# Patient Record
Sex: Male | Born: 1975 | Race: White | Hispanic: No | Marital: Married | Smoking: Current every day smoker
Health system: Southern US, Community
[De-identification: ages and names within clinical notes are randomized; demographics above are authoritative.]

## PROBLEM LIST (undated history)

## (undated) DIAGNOSIS — F32A Depression, unspecified: Secondary | ICD-10-CM

## (undated) DIAGNOSIS — N200 Calculus of kidney: Secondary | ICD-10-CM

## (undated) DIAGNOSIS — G894 Chronic pain syndrome: Secondary | ICD-10-CM

## (undated) DIAGNOSIS — F329 Major depressive disorder, single episode, unspecified: Secondary | ICD-10-CM

## (undated) DIAGNOSIS — M549 Dorsalgia, unspecified: Secondary | ICD-10-CM

## (undated) DIAGNOSIS — K219 Gastro-esophageal reflux disease without esophagitis: Secondary | ICD-10-CM

## (undated) DIAGNOSIS — Z765 Malingerer [conscious simulation]: Secondary | ICD-10-CM

## (undated) HISTORY — PX: HERNIA REPAIR: SHX51

## (undated) HISTORY — PX: APPENDECTOMY: SHX54

## (undated) HISTORY — PX: SHOULDER SURGERY: SHX246

---

## 2013-01-04 ENCOUNTER — Emergency Department (HOSPITAL_BASED_OUTPATIENT_CLINIC_OR_DEPARTMENT_OTHER)
Admission: EM | Admit: 2013-01-04 | Discharge: 2013-01-04 | Disposition: A | Discharge status: Home / Self Care | Payer: Managed Care, Other (non HMO) | Attending: Emergency Medicine | Admitting: Emergency Medicine

## 2013-01-04 ENCOUNTER — Emergency Department (HOSPITAL_BASED_OUTPATIENT_CLINIC_OR_DEPARTMENT_OTHER): Payer: Managed Care, Other (non HMO)

## 2013-01-04 ENCOUNTER — Encounter (HOSPITAL_BASED_OUTPATIENT_CLINIC_OR_DEPARTMENT_OTHER): Payer: Self-pay | Admitting: Emergency Medicine

## 2013-01-04 DIAGNOSIS — R109 Unspecified abdominal pain: Secondary | ICD-10-CM | POA: Diagnosis present

## 2013-01-04 DIAGNOSIS — R34 Anuria and oliguria: Secondary | ICD-10-CM | POA: Insufficient documentation

## 2013-01-04 DIAGNOSIS — R110 Nausea: Secondary | ICD-10-CM | POA: Insufficient documentation

## 2013-01-04 DIAGNOSIS — F17200 Nicotine dependence, unspecified, uncomplicated: Secondary | ICD-10-CM | POA: Insufficient documentation

## 2013-01-04 DIAGNOSIS — Z9089 Acquired absence of other organs: Secondary | ICD-10-CM | POA: Insufficient documentation

## 2013-01-04 DIAGNOSIS — Z87442 Personal history of urinary calculi: Secondary | ICD-10-CM | POA: Insufficient documentation

## 2013-01-04 HISTORY — DX: Calculus of kidney: N20.0

## 2013-01-04 LAB — CBC WITH DIFFERENTIAL/PLATELET
Basophils Relative: 1 % (ref 0–1)
Eosinophils Absolute: 0.3 10*3/uL (ref 0.0–0.7)
Eosinophils Relative: 4 % (ref 0–5)
HCT: 39.7 % (ref 39.0–52.0)
Hemoglobin: 14.3 g/dL (ref 13.0–17.0)
Lymphs Abs: 2.4 10*3/uL (ref 0.7–4.0)
MCH: 31.2 pg (ref 26.0–34.0)
MCHC: 36 g/dL (ref 30.0–36.0)
MCV: 86.5 fL (ref 78.0–100.0)
Monocytes Absolute: 0.4 10*3/uL (ref 0.1–1.0)
Monocytes Relative: 5 % (ref 3–12)
Neutrophils Relative %: 61 % (ref 43–77)
RBC: 4.59 MIL/uL (ref 4.22–5.81)
RDW: 13.3 % (ref 11.5–15.5)

## 2013-01-04 LAB — LIPASE, BLOOD: Lipase: 48 U/L (ref 11–59)

## 2013-01-04 LAB — COMPREHENSIVE METABOLIC PANEL
BUN: 8 mg/dL (ref 6–23)
Calcium: 9.3 mg/dL (ref 8.4–10.5)
Chloride: 105 mEq/L (ref 96–112)
GFR calc Af Amer: >90 mL/min (ref >90)
Glucose, Bld: 105 mg/dL — ABNORMAL HIGH (ref 70–99)
Total Protein: 6.7 g/dL (ref 6.0–8.3)

## 2013-01-04 LAB — URINALYSIS, ROUTINE W REFLEX MICROSCOPIC
Bilirubin Urine: NEGATIVE
Hgb urine dipstick: NEGATIVE
Ketones, ur: NEGATIVE mg/dL
Nitrite: NEGATIVE
Specific Gravity, Urine: 1.011 (ref 1.005–1.030)
Urobilinogen, UA: 0.2 mg/dL (ref 0.0–1.0)
pH: 5.5 (ref 5.0–8.0)

## 2013-01-04 MED ORDER — ONDANSETRON HCL 4 MG/2ML IJ SOLN
4.0000 mg | Freq: Once | INTRAMUSCULAR | Status: AC
  Administered 2013-01-04: 4 mg via INTRAVENOUS
  Filled 2013-01-04: qty 2

## 2013-01-04 MED ORDER — OXYCODONE-ACETAMINOPHEN 5-325 MG PO TABS: 1.0000 | ORAL_TABLET | Freq: Four times a day (QID) | ORAL | Status: DC | PRN

## 2013-01-04 MED ORDER — SODIUM CHLORIDE 0.9 % IV BOLUS (SEPSIS)
1000.0000 mL | INTRAVENOUS | Status: AC
  Administered 2013-01-04: 1000 mL via INTRAVENOUS

## 2013-01-04 MED ORDER — HYDROMORPHONE HCL PF 1 MG/ML IJ SOLN
1.0000 mg | INTRAMUSCULAR | Status: AC
  Administered 2013-01-04: 1 mg via INTRAVENOUS
  Filled 2013-01-04: qty 1

## 2013-01-04 NOTE — ED Notes (Signed)
Sts has had pain for the past few months, had sx last year removing a 6mm stone, hadn't had problems until recently, the last few days it has gotten worse, difficulty urinating.

## 2013-01-04 NOTE — ED Provider Notes (Signed)
CSN: [id number]     Arrival date & time 01/04/13  1804 History  This chart was scribed for Junius Argyle, MD by Bennett Scrape, ED Scribe. This patient was seen in room MH06/MH06 and the patient's care was started at 7:30 PM.   Chief Complaint  Patient presents with  . Flank Pain    Patient is a 37 y.o. male presenting with flank pain. The history is provided by the patient. No language interpreter was used.  Flank Pain This is a recurrent problem. The current episode started 2 days ago. The problem occurs daily. The problem has been gradually worsening. Pertinent negatives include no chest pain, no abdominal pain, no headaches and no shortness of breath. Nothing aggravates the symptoms. Nothing relieves the symptoms. He has tried acetaminophen for the symptoms. The treatment provided no relief.    HPI Comments: Kyle Franklin is a 37 y.o. male with a h/o kidney stones who presents to the Emergency Department complaining of intermittent right flank pain described as sharp. He reports that he has been having intermittent pain over the past few months but states that the pain increased in the past 2 days. He rates his pain an 8 out of 10 currently. He reports associated nausea that has been controlled with phenergan and difficulty urinating described as having to force the urine out. He states that he also passed a left sided kidney stone earlier this week without complications. He states that he has been seen for kidney stones at [facility] in the past but no prior [facility] facilities. He reports that he had kidney stone removed at [facility] last year with no further episodes until recently. He denies emesis and hematuria as associated symptoms. He denies any other chronic medical conditions but is a current everyday smoker.   Past Medical History  Diagnosis Date  . Kidney stones    Past Surgical History  Procedure Laterality Date  . Appendectomy    . Shoulder surgery     No family  history on file. History  Substance Use Topics  . Smoking status: Current Every Day Smoker -- 1.00 packs/day    Types: Cigarettes  . Smokeless tobacco: Not on file     Comment: Sts working on it - "going back on my vapor"  . Alcohol Use: No    Review of Systems  Constitutional: Negative for fever and fatigue.  HENT: Negative for congestion and drooling.   Eyes: Negative for pain.  Respiratory: Negative for cough and shortness of breath.   Cardiovascular: Negative for chest pain.  Gastrointestinal: Positive for nausea. Negative for vomiting, abdominal pain and diarrhea.  Genitourinary: Positive for flank pain and difficulty urinating. Negative for dysuria and hematuria.  Musculoskeletal: Negative for neck pain.  Skin: Negative for color change.  Neurological: Negative for dizziness and headaches.  Hematological: Negative for adenopathy.  Psychiatric/Behavioral: Negative for behavioral problems.  All other systems reviewed and are negative.    Allergies  Ciprofloxacin causes nausea; Tape; and Vicodin causes itching--per pt at beside.   Home Medications   Current Outpatient Rx  Name  Route  Sig  Dispense  Refill  . ibuprofen (ADVIL,MOTRIN) 800 MG tablet   Oral   Take 800 mg by mouth every 8 (eight) hours as needed for pain.          Triage vitals: Pulse 97  Temp(Src) 98.3 F (36.8 C) (Oral)  Resp 20  SpO2 100%  Physical Exam  Nursing note and vitals reviewed. Constitutional: He  is oriented to person, place, and time. He appears well-developed and well-nourished. No distress.  HENT:  Head: Normocephalic and atraumatic.  Mouth/Throat: Oropharynx is clear and moist.  Eyes: Conjunctivae and EOM are normal. Pupils are equal, round, and reactive to light.  Neck: Neck supple. No tracheal deviation present.  Cardiovascular: Normal rate, regular rhythm and normal heart sounds.   Pulmonary/Chest: Effort normal and breath sounds normal. No respiratory distress.  Abdominal:  Soft. There is no tenderness.  Musculoskeletal: Normal range of motion.  Right CVA tenderness   Neurological: He is alert and oriented to person, place, and time.  Skin: Skin is warm and dry.  Psychiatric: He has a normal mood and affect. His behavior is normal.    ED Course  Procedures (including critical care time)  DIAGNOSTIC STUDIES: Oxygen Saturation is 100% on room air, normal by my interpretation.    COORDINATION OF CARE: 7:34 PM-Discussed treatment plan which includes IV fluids, pain medications, CT of abdomen and UA with pt at bedside and pt agreed to plan.   Labs Review Labs Reviewed  COMPREHENSIVE METABOLIC PANEL - Abnormal; Notable for the following:    Glucose, Bld 105 (*)    All other components within normal limits  URINALYSIS, ROUTINE W REFLEX MICROSCOPIC  CBC WITH DIFFERENTIAL  LIPASE, BLOOD   Imaging Review Ct Abdomen Pelvis Wo Contrast  01/04/2013   CLINICAL DATA:  Right flank pain, history of kidney stones  EXAM: CT ABDOMEN AND PELVIS WITHOUT CONTRAST  TECHNIQUE: Multidetector CT imaging of the abdomen and pelvis was performed following the standard protocol without intravenous contrast.  COMPARISON:  08/22/2011  FINDINGS: The lung bases are free of acute infiltrate or sizable effusion.  The liver, gallbladder, spleen, adrenal glands and pancreas are within normal limits.  The kidneys are well visualized bilaterally. No obstructive changes are noted. A small nonobstructing stone is noted in the midportion of the right kidney. The appendix is been surgically removed. Mild diverticular change of the colon is seen. No diverticulitis is noted. The bladder is well distended. No pelvic mass lesion or sidewall abnormality is seen. The bony structures are within normal limits.  IMPRESSION: Small 2 mm nonobstructing right renal stone. No other focal abnormality is noted.   Electronically Signed   By: Alcide Clever M.D.   On: 01/04/2013 20:43    EKG Interpretation   None        MDM   1. Right flank pain    7:59 PM 37 y.o. male who presents with intermittent right flank pain for several months and acute worsening over the last few days. The patient notes that he passed a kidney stone earlier in the week. He denies any fevers, has had some nausea but no vomiting. He notes frequency. He is afebrile and vital signs are unremarkable here. He reports that he had previous instrumentation for removal of a 6 mm stone at [facility]. Will get screening lab work and CT scan.  9:51 PM: Labs grossly normal. CT shows mall 2mm right renal stone. Possible that pt passed the stone pta.  I have discussed the diagnosis/risks/treatment options with the patient and believe the pt to be eligible for discharge home to follow-up with his urologist. We also discussed returning to the ED immediately if new or worsening sx occur. We discussed the sx which are most concerning (e.g., worsening pain, fever) that necessitate immediate return. Any new prescriptions provided to the patient are listed below.  New Prescriptions   OXYCODONE-ACETAMINOPHEN (PERCOCET)  5-325 MG PER TABLET    Take 1 tablet by mouth every 6 (six) hours as needed for pain.      I personally performed the services described in this documentation, which was scribed in my presence. The recorded information has been reviewed and is accurate.    Junius Argyle, MD 01/04/13 2152

## 2013-01-04 NOTE — ED Notes (Signed)
Pt returned to room from CT

## 2014-05-19 ENCOUNTER — Emergency Department (HOSPITAL_BASED_OUTPATIENT_CLINIC_OR_DEPARTMENT_OTHER)
Admission: EM | Admit: 2014-05-19 | Discharge: 2014-05-19 | Disposition: A | Discharge status: Home / Self Care | Payer: Medicaid Other | Attending: Emergency Medicine | Admitting: Emergency Medicine

## 2014-05-19 ENCOUNTER — Encounter (HOSPITAL_BASED_OUTPATIENT_CLINIC_OR_DEPARTMENT_OTHER): Payer: Self-pay

## 2014-05-19 DIAGNOSIS — R1030 Lower abdominal pain, unspecified: Secondary | ICD-10-CM | POA: Diagnosis present

## 2014-05-19 DIAGNOSIS — Z79899 Other long term (current) drug therapy: Secondary | ICD-10-CM | POA: Insufficient documentation

## 2014-05-19 DIAGNOSIS — Z87442 Personal history of urinary calculi: Secondary | ICD-10-CM | POA: Insufficient documentation

## 2014-05-19 DIAGNOSIS — Z7289 Other problems related to lifestyle: Secondary | ICD-10-CM | POA: Diagnosis not present

## 2014-05-19 DIAGNOSIS — Z765 Malingerer [conscious simulation]: Secondary | ICD-10-CM

## 2014-05-19 DIAGNOSIS — R110 Nausea: Secondary | ICD-10-CM | POA: Insufficient documentation

## 2014-05-19 DIAGNOSIS — R109 Unspecified abdominal pain: Secondary | ICD-10-CM

## 2014-05-19 LAB — BASIC METABOLIC PANEL
Anion gap: 10 (ref 5–15)
BUN: 8 mg/dL (ref 6–23)
CALCIUM: 8.8 mg/dL (ref 8.4–10.5)
CO2: 25 mmol/L (ref 19–32)
Chloride: 104 mmol/L (ref 96–112)
Creatinine, Ser: 0.91 mg/dL (ref 0.50–1.35)
GFR calc Af Amer: >90 mL/min (ref >90)
GFR calc non Af Amer: >90 mL/min (ref >90)
GLUCOSE: 167 mg/dL — AB (ref 70–99)
POTASSIUM: 3.5 mmol/L (ref 3.5–5.1)
Sodium: 139 mmol/L (ref 135–145)

## 2014-05-19 LAB — CBC WITH DIFFERENTIAL/PLATELET
Basophils Absolute: 0 10*3/uL (ref 0.0–0.1)
Basophils Relative: 0 % (ref 0–1)
Eosinophils Absolute: 0.1 10*3/uL (ref 0.0–0.7)
Eosinophils Relative: 1 % (ref 0–5)
HEMATOCRIT: 42.7 % (ref 39.0–52.0)
HEMOGLOBIN: 14.5 g/dL (ref 13.0–17.0)
LYMPHS PCT: 8 % — AB (ref 12–46)
Lymphs Abs: 1.1 10*3/uL (ref 0.7–4.0)
MCH: 30.9 pg (ref 26.0–34.0)
MCHC: 34 g/dL (ref 30.0–36.0)
MCV: 90.9 fL (ref 78.0–100.0)
MONO ABS: 0.6 10*3/uL (ref 0.1–1.0)
Monocytes Relative: 4 % (ref 3–12)
Neutro Abs: 11.1 10*3/uL — ABNORMAL HIGH (ref 1.7–7.7)
Neutrophils Relative %: 87 % — ABNORMAL HIGH (ref 43–77)
PLATELETS: 168 10*3/uL (ref 150–400)
RBC: 4.7 MIL/uL (ref 4.22–5.81)
RDW: 13.4 % (ref 11.5–15.5)
WBC: 12.9 10*3/uL — ABNORMAL HIGH (ref 4.0–10.5)

## 2014-05-19 LAB — HEPATIC FUNCTION PANEL
ALK PHOS: 42 U/L (ref 39–117)
ALT: 30 U/L (ref 0–53)
AST: 29 U/L (ref 0–37)
Albumin: 3.8 g/dL (ref 3.5–5.2)
BILIRUBIN DIRECT: 0.1 mg/dL (ref 0.0–0.5)
BILIRUBIN TOTAL: 0.6 mg/dL (ref 0.3–1.2)
Indirect Bilirubin: 0.5 mg/dL (ref 0.3–0.9)
Total Protein: 6.4 g/dL (ref 6.0–8.3)

## 2014-05-19 LAB — URINALYSIS, ROUTINE W REFLEX MICROSCOPIC
Bilirubin Urine: NEGATIVE
Glucose, UA: NEGATIVE mg/dL
Hgb urine dipstick: NEGATIVE
Ketones, ur: NEGATIVE mg/dL
Leukocytes, UA: NEGATIVE
NITRITE: NEGATIVE
PH: 8 (ref 5.0–8.0)
Protein, ur: NEGATIVE mg/dL
SPECIFIC GRAVITY, URINE: 1.007 (ref 1.005–1.030)
UROBILINOGEN UA: 1 mg/dL (ref 0.0–1.0)

## 2014-05-19 MED ORDER — MORPHINE SULFATE 4 MG/ML IJ SOLN
4.0000 mg | Freq: Once | INTRAMUSCULAR | Status: AC
  Administered 2014-05-19: 4 mg via INTRAVENOUS
  Filled 2014-05-19: qty 1

## 2014-05-19 MED ORDER — SODIUM CHLORIDE 0.9 % IV BOLUS (SEPSIS)
1000.0000 mL | Freq: Once | INTRAVENOUS | Status: AC
  Administered 2014-05-19: 1000 mL via INTRAVENOUS

## 2014-05-19 MED ORDER — OXYCODONE-ACETAMINOPHEN 5-325 MG PO TABS: 1.0000 | ORAL_TABLET | ORAL | Status: DC | PRN

## 2014-05-19 MED ORDER — KETOROLAC TROMETHAMINE 30 MG/ML IJ SOLN
30.0000 mg | Freq: Once | INTRAMUSCULAR | Status: AC
  Administered 2014-05-19: 30 mg via INTRAVENOUS
  Filled 2014-05-19: qty 1

## 2014-05-19 MED ORDER — ONDANSETRON 4 MG PO TBDP: 4.0000 mg | ORAL_TABLET | Freq: Three times a day (TID) | ORAL | Status: DC | PRN

## 2014-05-19 MED ORDER — IBUPROFEN 800 MG PO TABS: 800.0000 mg | ORAL_TABLET | Freq: Three times a day (TID) | ORAL | Status: DC | PRN

## 2014-05-19 MED ORDER — ONDANSETRON HCL 4 MG/2ML IJ SOLN
4.0000 mg | Freq: Once | INTRAMUSCULAR | Status: AC
  Administered 2014-05-19: 4 mg via INTRAVENOUS
  Filled 2014-05-19: qty 2

## 2014-05-19 NOTE — Discharge Instructions (Signed)
Flank Pain °Flank pain refers to pain that is located on the side of the body between the upper abdomen and the back. The pain may occur over a short period of time (acute) or may be long-term or reoccurring (chronic). It may be mild or severe. Flank pain can be caused by many things. °CAUSES  °Some of the more common causes of flank pain include: °· Muscle strains.   °· Muscle spasms.   °· A disease of your spine (vertebral disk disease).   °· A lung infection (pneumonia).   °· Fluid around your lungs (pulmonary edema).   °· A kidney infection.   °· Kidney stones.   °· A very painful skin rash caused by the chickenpox virus (shingles).   °· Gallbladder disease.   °HOME CARE INSTRUCTIONS  °Home care will depend on the cause of your pain. In general, °· Rest as directed by your caregiver. °· Drink enough fluids to keep your urine clear or pale yellow. °· Only take over-the-counter or prescription medicines as directed by your caregiver. Some medicines may help relieve the pain. °· Tell your caregiver about any changes in your pain. °· Follow up with your caregiver as directed. °SEEK IMMEDIATE MEDICAL CARE IF:  °· Your pain is not controlled with medicine.   °· You have new or worsening symptoms. °· Your pain increases.   °· You have abdominal pain.   °· You have shortness of breath.   °· You have persistent nausea or vomiting.   °· You have swelling in your abdomen.   °· You feel faint or pass out.   °· You have blood in your urine. °· You have a fever or persistent symptoms for more than 2-3 days. °· You have a fever and your symptoms suddenly get worse. °MAKE SURE YOU:  °· Understand these instructions. °· Will watch your condition. °· Will get help right away if you are not doing well or get worse. °Document Released: 04/13/2005 Document Revised: 11/15/2011 Document Reviewed: 10/05/2011 °ExitCare® Patient Information ©2015 [employer]. This information is not intended to replace advice given to you by your  health care provider. Make sure you discuss any questions you have with your health care provider. ° °Kidney Stones °Kidney stones (urolithiasis) are deposits that form inside your kidneys. The intense pain is caused by the stone moving through the urinary tract. When the stone moves, the ureter goes into spasm around the stone. The stone is usually passed in the urine.  °CAUSES  °· A disorder that makes certain neck glands produce too much parathyroid hormone (primary hyperparathyroidism). °· A buildup of uric acid crystals, similar to gout in your joints. °· Narrowing (stricture) of the ureter. °· A kidney obstruction present at birth (congenital obstruction). °· Previous surgery on the kidney or ureters. °· Numerous kidney infections. °SYMPTOMS  °· Feeling sick to your stomach (nauseous). °· Throwing up (vomiting). °· Blood in the urine (hematuria). °· Pain that usually spreads (radiates) to the groin. °· Frequency or urgency of urination. °DIAGNOSIS  °· Taking a history and physical exam. °· Blood or urine tests. °· CT scan. °· Occasionally, an examination of the inside of the urinary bladder (cystoscopy) is performed. °TREATMENT  °· Observation. °· Increasing your fluid intake. °· Extracorporeal shock wave lithotripsy--This is a noninvasive procedure that uses shock waves to break up kidney stones. °· Surgery may be needed if you have severe pain or persistent obstruction. There are various surgical procedures. Most of the procedures are performed with the use of small instruments. Only small incisions are needed to accommodate these instruments,   so recovery time is minimized. °The size, location, and chemical composition are all important variables that will determine the proper choice of action for you. Talk to your health care provider to better understand your situation so that you will minimize the risk of injury to yourself and your kidney.  °HOME CARE INSTRUCTIONS  °· Drink enough water and fluids to  keep your urine clear or pale yellow. This will help you to pass the stone or stone fragments. °· Strain all urine through the provided strainer. Keep all particulate matter and stones for your health care provider to see. The stone causing the pain may be as small as a grain of salt. It is very important to use the strainer each and every time you pass your urine. The collection of your stone will allow your health care provider to analyze it and verify that a stone has actually passed. The stone analysis will often identify what you can do to reduce the incidence of recurrences. °· Only take over-the-counter or prescription medicines for pain, discomfort, or fever as directed by your health care provider. °· Make a follow-up appointment with your health care provider as directed. °· Get follow-up X-rays if required. The absence of pain does not always mean that the stone has passed. It may have only stopped moving. If the urine remains completely obstructed, it can cause loss of kidney function or even complete destruction of the kidney. It is your responsibility to make sure X-rays and follow-ups are completed. Ultrasounds of the kidney can show blockages and the status of the kidney. Ultrasounds are not associated with any radiation and can be performed easily in a matter of minutes. °SEEK MEDICAL CARE IF: °· You experience pain that is progressive and unresponsive to any pain medicine you have been prescribed. °SEEK IMMEDIATE MEDICAL CARE IF:  °· Pain cannot be controlled with the prescribed medicine. °· You have a fever or shaking chills. °· The severity or intensity of pain increases over 18 hours and is not relieved by pain medicine. °· You develop a new onset of abdominal pain. °· You feel faint or pass out. °· You are unable to urinate. °MAKE SURE YOU:  °· Understand these instructions. °· Will watch your condition. °· Will get help right away if you are not doing well or get worse. °Document Released:  02/20/2005 Document Revised: 10/23/2012 Document Reviewed: 07/24/2012 °ExitCare® Patient Information ©2015 [employer]. This information is not intended to replace advice given to you by your health care provider. Make sure you discuss any questions you have with your health care provider. ° °

## 2014-05-19 NOTE — ED Provider Notes (Addendum)
TIME SEEN: 9:15 AM  CHIEF COMPLAINT: Right flank pain  HPI: Pt is a 39 y.o. male with history of kidney stone 2 presents to the emergency room with right flank pain that is similar to his prior episodes of kidney stones that started this morning around 6:30 AM. This had nausea but no vomiting. No diarrhea. No fevers but has had chills. Has had 2 prior ureteral stents placed. Is followed by [facility] urology. Reports pain radiates into his lower abdomen. No testicular pain or swelling. No penile discharge. Has noticed some hematuria. No dysuria. No urinary frequency or urgency.  ROS: See HPI Constitutional: no fever  Eyes: no drainage  ENT: no runny nose   Cardiovascular:  no chest pain  Resp: no SOB  GI: no vomiting GU: no dysuria Integumentary: no rash  Allergy: no hives  Musculoskeletal: no leg swelling  Neurological: no slurred speech ROS otherwise negative  PAST MEDICAL HISTORY/PAST SURGICAL HISTORY:  Past Medical History  Diagnosis Date  . Kidney stones     MEDICATIONS:  Prior to Admission medications   Medication Sig Start Date End Date Taking? Authorizing Provider  baclofen (LIORESAL) 10 MG tablet Take 10 mg by mouth Nightly.   Yes Historical Provider, MD  escitalopram (LEXAPRO) 20 MG tablet Take 20 mg by mouth daily.   Yes Historical Provider, MD  ibuprofen (ADVIL,MOTRIN) 800 MG tablet Take 800 mg by mouth every 8 (eight) hours as needed for pain.    Historical Provider, MD  oxyCODONE-acetaminophen (PERCOCET) 5-325 MG per tablet Take 1 tablet by mouth every 6 (six) hours as needed for pain. 01/04/13   Purvis Sheffield, MD    ALLERGIES:  Allergies  Allergen Reactions  . Ciprofloxacin     Makes me sick  . Tape     Surgical tape  . Tramadol Hcl Nausea Only  . Vicodin [Hydrocodone-Acetaminophen]     Itching    SOCIAL HISTORY:  History  Substance Use Topics  . Smoking status: Current Every Day Smoker -- 1.00 packs/day    Types: Cigarettes  . Smokeless tobacco: Not  on file     Comment: Sts working on it - "going back on my vapor"  . Alcohol Use: No    FAMILY HISTORY: History reviewed. No pertinent family history.  EXAM: BP 134/76 mmHg  Pulse 82  Temp(Src) 97.9 F (36.6 C) (Oral)  Resp 20  Ht  (1.702 m)  Wt 160 lb (72.576 kg)  BMI 25.05 kg/m2  SpO2 100% CONSTITUTIONAL: Alert and oriented and responds appropriately to questions. Well-appearing; well-nourished and nontoxic, in no distress HEAD: Normocephalic EYES: Conjunctivae clear, PERRL ENT: normal nose; no rhinorrhea; moist mucous membranes; pharynx without lesions noted NECK: Supple, no meningismus, no LAD  CARD: RRR; S1 and S2 appreciated; no murmurs, no clicks, no rubs, no gallops RESP: Normal chest excursion without splinting or tachypnea; breath sounds clear and equal bilaterally; no wheezes, no rhonchi, no rales,  ABD/GI: Normal bowel sounds; non-distended; soft, non-tender, no rebound, no guarding GU:  Normal external genitalia, no blood or discharge at the urethral meatus, circumcised, no testicular pain or masses on examination, no scrotal swelling or masses, 2+ femoral pulses bilaterally, no perineal warmth or erythema or crepitus BACK:  The back appears normal and is non-tender to palpation, mild right-sided CVA tenderness, no midline spinal tenderness or step-off or deformity EXT: Normal ROM in all joints; non-tender to palpation; no edema; normal capillary refill; no cyanosis    SKIN: Normal color for age and race;  warm NEURO: Moves all extremities equally, sensation to light touch intact diffusely; normal gait PSYCH: The patient's mood and manner are appropriate. Grooming and personal hygiene are appropriate.  MEDICAL DECISION MAKING: Patient here with right flank pain that feels similar to his prior kidney stones. Will obtain labs, urine. Will give Toradol, morphine, Zofran and reassess. He has had a CT scan here in 2014 which confirmed ureterolithiasis. I do not feel he  needs repeat imaging today.  ED PROGRESS: Patient's labs show leukocytosis of 12.9 with left shift. Urine however is clear with no hematuria or sign of infection. Patient reports feeling much better. Despite clean urine I still suspect this is a kidney stone as it is not reproducible with palpation of his back or movement and he has a document history of kidney stones. We'll discharge with pain medication, nausea medicine and advised him to fall up his primary care physician and urologist. Discussed return precautions. He verbalizes understanding and is comfortable with this plan.     Layla MawKristen N Tillman Kazmierski, DO 05/19/14 1005   10:30  AM  Pharmacist informed me that patient had 120 oxycodone 10/325 mg tablets filled on 05/04/14. I've asked him to destroy the prescription for Percocet # 20 that I provided him today.    Layla MawKristen N Phuoc Huy, DO 05/19/14 1031

## 2014-05-19 NOTE — ED Notes (Signed)
Pt verbalizes understanding of D/c instructions, reports pain much improved

## 2014-05-19 NOTE — ED Notes (Signed)
Pt ambulated to room, reports pain 10/10 severe R Flank " radiating to testicles. "  Started this am at 0630, [facility] Vitals Stable

## 2014-05-28 ENCOUNTER — Emergency Department (HOSPITAL_BASED_OUTPATIENT_CLINIC_OR_DEPARTMENT_OTHER)
Admission: EM | Admit: 2014-05-28 | Discharge: 2014-05-28 | Disposition: A | Discharge status: Home / Self Care | Payer: Medicaid Other | Attending: Emergency Medicine | Admitting: Emergency Medicine

## 2014-05-28 ENCOUNTER — Encounter (HOSPITAL_BASED_OUTPATIENT_CLINIC_OR_DEPARTMENT_OTHER): Payer: Self-pay | Admitting: *Deleted

## 2014-05-28 DIAGNOSIS — R1030 Lower abdominal pain, unspecified: Secondary | ICD-10-CM | POA: Diagnosis present

## 2014-05-28 DIAGNOSIS — Z720 Tobacco use: Secondary | ICD-10-CM | POA: Insufficient documentation

## 2014-05-28 DIAGNOSIS — M5432 Sciatica, left side: Secondary | ICD-10-CM | POA: Diagnosis not present

## 2014-05-28 DIAGNOSIS — Z79899 Other long term (current) drug therapy: Secondary | ICD-10-CM | POA: Diagnosis not present

## 2014-05-28 DIAGNOSIS — Z791 Long term (current) use of non-steroidal anti-inflammatories (NSAID): Secondary | ICD-10-CM | POA: Diagnosis not present

## 2014-05-28 DIAGNOSIS — Z87442 Personal history of urinary calculi: Secondary | ICD-10-CM | POA: Insufficient documentation

## 2014-05-28 LAB — URINALYSIS, ROUTINE W REFLEX MICROSCOPIC
BILIRUBIN URINE: NEGATIVE
Glucose, UA: NEGATIVE mg/dL
Hgb urine dipstick: NEGATIVE
KETONES UR: NEGATIVE mg/dL
Leukocytes, UA: NEGATIVE
NITRITE: NEGATIVE
PH: 6 (ref 5.0–8.0)
PROTEIN: NEGATIVE mg/dL
Specific Gravity, Urine: 1.023 (ref 1.005–1.030)
UROBILINOGEN UA: 1 mg/dL (ref 0.0–1.0)

## 2014-05-28 MED ORDER — KETOROLAC TROMETHAMINE 60 MG/2ML IM SOLN
60.0000 mg | Freq: Once | INTRAMUSCULAR | Status: AC
  Administered 2014-05-28: 60 mg via INTRAMUSCULAR
  Filled 2014-05-28: qty 2

## 2014-05-28 MED ORDER — DEXAMETHASONE SODIUM PHOSPHATE 10 MG/ML IJ SOLN
10.0000 mg | Freq: Once | INTRAMUSCULAR | Status: AC
  Administered 2014-05-28: 10 mg via INTRAMUSCULAR
  Filled 2014-05-28: qty 1

## 2014-05-28 MED ORDER — OXYCODONE-ACETAMINOPHEN 5-325 MG PO TABS
1.0000 | ORAL_TABLET | Freq: Once | ORAL | Status: AC
  Administered 2014-05-28: 1 via ORAL
  Filled 2014-05-28: qty 1

## 2014-05-28 MED ORDER — PREDNISONE 20 MG PO TABS: ORAL_TABLET | ORAL | Status: DC

## 2014-05-28 MED ORDER — OXYCODONE-ACETAMINOPHEN 5-325 MG PO TABS: 1.0000 | ORAL_TABLET | ORAL | Status: DC | PRN

## 2014-05-28 NOTE — Discharge Instructions (Signed)
Sciatica Sciatica is pain, weakness, numbness, or tingling along the path of the sciatic nerve. The nerve starts in the lower back and runs down the back of each leg. The nerve controls the muscles in the lower leg and in the back of the knee, while also providing sensation to the back of the thigh, lower leg, and the sole of your foot. Sciatica is a symptom of another medical condition. For instance, nerve damage or certain conditions, such as a herniated disk or bone spur on the spine, pinch or put pressure on the sciatic nerve. This causes the pain, weakness, or other sensations normally associated with sciatica. Generally, sciatica only affects one side of the body. CAUSES   Herniated or slipped disc.  Degenerative disk disease.  A pain disorder involving the narrow muscle in the buttocks (piriformis syndrome).  Pelvic injury or fracture.  Pregnancy.  Tumor (rare). SYMPTOMS  Symptoms can vary from mild to very severe. The symptoms usually travel from the low back to the buttocks and down the back of the leg. Symptoms can include:  Mild tingling or dull aches in the lower back, leg, or hip.  Numbness in the back of the calf or sole of the foot.  Burning sensations in the lower back, leg, or hip.  Sharp pains in the lower back, leg, or hip.  Leg weakness.  Severe back pain inhibiting movement. These symptoms may get worse with coughing, sneezing, laughing, or prolonged sitting or standing. Also, being overweight may worsen symptoms. DIAGNOSIS  Your caregiver will perform a physical exam to look for common symptoms of sciatica. He or she may ask you to do certain movements or activities that would trigger sciatic nerve pain. Other tests may be performed to find the cause of the sciatica. These may include:  Blood tests.  X-rays.  Imaging tests, such as an MRI or CT scan. TREATMENT  Treatment is directed at the cause of the sciatic pain. Sometimes, treatment is not necessary  and the pain and discomfort goes away on its own. If treatment is needed, your caregiver may suggest:  Over-the-counter medicines to relieve pain.  Prescription medicines, such as anti-inflammatory medicine, muscle relaxants, or narcotics.  Applying heat or ice to the painful area.  Steroid injections to lessen pain, irritation, and inflammation around the nerve.  Reducing activity during periods of pain.  Exercising and stretching to strengthen your abdomen and improve flexibility of your spine. Your caregiver may suggest losing weight if the extra weight makes the back pain worse.  Physical therapy.  Surgery to eliminate what is pressing or pinching the nerve, such as a bone spur or part of a herniated disk. HOME CARE INSTRUCTIONS   Only take over-the-counter or prescription medicines for pain or discomfort as directed by your caregiver.  Apply ice to the affected area for 20 minutes, 3-4 times a day for the first 48-72 hours. Then try heat in the same way.  Exercise, stretch, or perform your usual activities if these do not aggravate your pain.  Attend physical therapy sessions as directed by your caregiver.  Keep all follow-up appointments as directed by your caregiver.  Do not wear high heels or shoes that do not provide proper support.  Check your mattress to see if it is too soft. A firm mattress may lessen your pain and discomfort. SEEK IMMEDIATE MEDICAL CARE IF:   You lose control of your bowel or bladder (incontinence).  You have increasing weakness in the lower back, pelvis, buttocks,   or legs.  You have redness or swelling of your back.  You have a burning sensation when you urinate.  You have pain that gets worse when you lie down or awakens you at night.  Your pain is worse than you have experienced in the past.  Your pain is lasting longer than 4 weeks.  You are suddenly losing weight without reason. MAKE SURE YOU:  Understand these  instructions.  Will watch your condition.  Will get help right away if you are not doing well or get worse. Document Released: 02/14/2001 Document Revised: 08/22/2011 Document Reviewed: 07/02/2011 Exit[employer] Patient Information 2015 [employer]. This information is not intended to replace advice given to you by your health care provider. Make sure you discuss any questions you have with your health care provider.  

## 2014-05-28 NOTE — ED Provider Notes (Signed)
CSN: [id number]     Arrival date & time 05/28/14  1935 History  This chart was scribed for Kyle Crease, MD by Roxy Cedar, ED Scribe. This patient was seen in room MH02/MH02 and the patient's care was started at 8:31 PM.   Chief Complaint  Patient presents with  . Flank Pain   Patient is a 39 y.o. male presenting with flank pain. The history is provided by the patient. No language interpreter was used.  Flank Pain This is a new problem. The current episode started 6 to 12 hours ago. The problem occurs constantly. The problem has not changed since onset.Pertinent negatives include no chest pain, no abdominal pain, no headaches and no shortness of breath. Nothing aggravates the symptoms. Nothing relieves the symptoms. He has tried nothing for the symptoms.   HPI Comments: Kyle Franklin is a 39 y.o. male with a PMHx of kidney stones, appendectomy and shoulder surgery, who presents to the Emergency Department complaining of lower left sided back pain that radiates down to left leg which began earlier today. He denies associated dysuria, trouble urinating or bladder incontinence.   Past Medical History  Diagnosis Date  . Kidney stones    Past Surgical History  Procedure Laterality Date  . Appendectomy    . Shoulder surgery     No family history on file. History  Substance Use Topics  . Smoking status: Current Every Day Smoker -- 1.00 packs/day    Types: Cigarettes  . Smokeless tobacco: Not on file     Comment: Sts working on it - "going back on my vapor"  . Alcohol Use: No   Review of Systems  Respiratory: Negative for shortness of breath.   Cardiovascular: Negative for chest pain.  Gastrointestinal: Negative for abdominal pain.  Genitourinary: Positive for flank pain.  Musculoskeletal: Positive for back pain.  Neurological: Negative for headaches.  All other systems reviewed and are negative.  Allergies  Ciprofloxacin; Tape; Tramadol hcl; and Vicodin  Home  Medications   Prior to Admission medications   Medication Sig Start Date End Date Taking? Authorizing Provider  baclofen (LIORESAL) 10 MG tablet Take 10 mg by mouth Nightly.    Historical Provider, MD  escitalopram (LEXAPRO) 20 MG tablet Take 20 mg by mouth daily.    Historical Provider, MD  ibuprofen (ADVIL,MOTRIN) 800 MG tablet Take 1 tablet (800 mg total) by mouth every 8 (eight) hours as needed for mild pain. 05/19/14   Kristen N Ward, DO  ondansetron (ZOFRAN ODT) 4 MG disintegrating tablet Take 1 tablet (4 mg total) by mouth every 8 (eight) hours as needed for nausea or vomiting. 05/19/14   Kristen N Ward, DO  oxyCODONE-acetaminophen (PERCOCET/ROXICET) 5-325 MG per tablet Take 1 tablet by mouth every 4 (four) hours as needed. 05/19/14   Kristen N Ward, DO   Triage Vitals: BP 122/80 mmHg  Pulse 81  Temp(Src) 98.3 F (36.8 C) (Oral)  Resp 20  Ht  (1.702 m)  Wt 160 lb (72.576 kg)  BMI 25.05 kg/m2  SpO2 97%  Physical Exam  Constitutional: He is oriented to person, place, and time. He appears well-developed and well-nourished. No distress.  HENT:  Head: Normocephalic and atraumatic.  Right Ear: Hearing normal.  Left Ear: Hearing normal.  Nose: Nose normal.  Mouth/Throat: Oropharynx is clear and moist and mucous membranes are normal.  Eyes: Conjunctivae and EOM are normal. Pupils are equal, round, and reactive to light.  Neck: Normal range of motion. Neck supple.  Cardiovascular: Regular rhythm, S1 normal and S2 normal.  Exam reveals no gallop and no friction rub.   No murmur heard. Pulmonary/Chest: Effort normal and breath sounds normal. No respiratory distress. He exhibits no tenderness.  Abdominal: Soft. Normal appearance and bowel sounds are normal. There is no hepatosplenomegaly. There is no tenderness. There is no rebound, no guarding, no tenderness at McBurney's point and negative Murphy's sign. No hernia.  Musculoskeletal: Normal range of motion. He exhibits tenderness.   Neurological: He is alert and oriented to person, place, and time. He has normal strength and normal reflexes. No cranial nerve deficit or sensory deficit. Coordination normal. GCS eye subscore is 4. GCS verbal subscore is 5. GCS motor subscore is 6.  Skin: Skin is warm, dry and intact. No rash noted. No cyanosis.  Psychiatric: He has a normal mood and affect. His speech is normal and behavior is normal. Thought content normal.  Nursing note and vitals reviewed.  ED Course  Procedures (including critical care time)  DIAGNOSTIC STUDIES: Oxygen Saturation is 97% on RA, normal by my interpretation.    COORDINATION OF CARE: 8:32 PM- Ordered diagnostic urinalysis. Discussed plans to give patient medication for pain management. Pt advised of plan for treatment and pt agrees.  Labs Review Labs Reviewed  URINALYSIS, ROUTINE W REFLEX MICROSCOPIC   Imaging Review No results found.   EKG Interpretation None     MDM   Final diagnoses:  None  sciatica  Patient presents to the ER with musculoskeletal back pain. Examination reveals back tenderness without any associated neurologic findings. Patient's strength, sensation and reflexes were normal. As such, patient did not require any imaging or further studies. Patient was treated with analgesia.  I personally performed the services described in this documentation, which was scribed in my presence. The recorded information has been reviewed and is accurate.    Kyle Creasehristopher J Pollina, MD 05/28/14 2121

## 2014-05-28 NOTE — ED Notes (Signed)
Right flank pain. States he has a separate pain in his lower back that radiates into his left leg.

## 2014-06-07 ENCOUNTER — Emergency Department (HOSPITAL_BASED_OUTPATIENT_CLINIC_OR_DEPARTMENT_OTHER)
Admission: EM | Admit: 2014-06-07 | Discharge: 2014-06-07 | Discharge status: Against Medical Advice | Payer: Medicaid Other | Attending: Emergency Medicine | Admitting: Emergency Medicine

## 2014-06-07 ENCOUNTER — Encounter (HOSPITAL_BASED_OUTPATIENT_CLINIC_OR_DEPARTMENT_OTHER): Payer: Self-pay

## 2014-06-07 DIAGNOSIS — R1031 Right lower quadrant pain: Secondary | ICD-10-CM | POA: Insufficient documentation

## 2014-06-07 DIAGNOSIS — Z720 Tobacco use: Secondary | ICD-10-CM | POA: Insufficient documentation

## 2014-06-07 HISTORY — DX: Calculus of kidney: N20.0

## 2014-06-07 LAB — URINALYSIS, ROUTINE W REFLEX MICROSCOPIC
BILIRUBIN URINE: NEGATIVE
GLUCOSE, UA: NEGATIVE mg/dL
Hgb urine dipstick: NEGATIVE
Ketones, ur: NEGATIVE mg/dL
Leukocytes, UA: NEGATIVE
Nitrite: NEGATIVE
Protein, ur: NEGATIVE mg/dL
Specific Gravity, Urine: 1.02 (ref 1.005–1.030)
Urobilinogen, UA: 0.2 mg/dL (ref 0.0–1.0)
pH: 5.5 (ref 5.0–8.0)

## 2014-06-07 NOTE — ED Notes (Signed)
Third call to room no answer.

## 2014-06-07 NOTE — ED Notes (Signed)
Pt reports several days of right flank pain with radiation to right groin, feeling like "bladder is going to explode" and feeling of not emptying.  Denies dysuria or blood in urine.  Hx of kidney stones.

## 2014-06-07 NOTE — ED Notes (Signed)
Called to room no answer

## 2014-06-18 ENCOUNTER — Emergency Department (HOSPITAL_BASED_OUTPATIENT_CLINIC_OR_DEPARTMENT_OTHER)
Admission: EM | Admit: 2014-06-18 | Discharge: 2014-06-18 | Disposition: A | Discharge status: Home / Self Care | Payer: Medicaid Other | Attending: Emergency Medicine | Admitting: Emergency Medicine

## 2014-06-18 ENCOUNTER — Encounter (HOSPITAL_BASED_OUTPATIENT_CLINIC_OR_DEPARTMENT_OTHER): Payer: Self-pay | Admitting: *Deleted

## 2014-06-18 DIAGNOSIS — M545 Low back pain, unspecified: Secondary | ICD-10-CM

## 2014-06-18 DIAGNOSIS — Y92091 Bathroom in other non-institutional residence as the place of occurrence of the external cause: Secondary | ICD-10-CM | POA: Diagnosis not present

## 2014-06-18 DIAGNOSIS — Y9301 Activity, walking, marching and hiking: Secondary | ICD-10-CM | POA: Diagnosis not present

## 2014-06-18 DIAGNOSIS — S3992XA Unspecified injury of lower back, initial encounter: Secondary | ICD-10-CM | POA: Diagnosis present

## 2014-06-18 DIAGNOSIS — Z79899 Other long term (current) drug therapy: Secondary | ICD-10-CM | POA: Diagnosis not present

## 2014-06-18 DIAGNOSIS — Z87442 Personal history of urinary calculi: Secondary | ICD-10-CM | POA: Diagnosis not present

## 2014-06-18 DIAGNOSIS — Z720 Tobacco use: Secondary | ICD-10-CM | POA: Diagnosis not present

## 2014-06-18 DIAGNOSIS — Z7952 Long term (current) use of systemic steroids: Secondary | ICD-10-CM | POA: Insufficient documentation

## 2014-06-18 DIAGNOSIS — W010XXA Fall on same level from slipping, tripping and stumbling without subsequent striking against object, initial encounter: Secondary | ICD-10-CM | POA: Diagnosis not present

## 2014-06-18 DIAGNOSIS — Y998 Other external cause status: Secondary | ICD-10-CM | POA: Diagnosis not present

## 2014-06-18 MED ORDER — CYCLOBENZAPRINE HCL 10 MG PO TABS: 10.0000 mg | ORAL_TABLET | Freq: Three times a day (TID) | ORAL | Status: AC | PRN

## 2014-06-18 MED ORDER — OXYCODONE-ACETAMINOPHEN 5-325 MG PO TABS
2.0000 | ORAL_TABLET | Freq: Once | ORAL | Status: AC
  Administered 2014-06-18: 2 via ORAL
  Filled 2014-06-18: qty 2

## 2014-06-18 MED ORDER — OXYCODONE-ACETAMINOPHEN 5-325 MG PO TABS: 1.0000 | ORAL_TABLET | Freq: Four times a day (QID) | ORAL | Status: DC | PRN

## 2014-06-18 NOTE — ED Notes (Signed)
Pt states was walking in bathroom and suddenly fell.  States did not trip,  C/o back pain

## 2014-06-18 NOTE — ED Notes (Signed)
Pt reports back pain today- reports "gave out" and he fell approx 45 mins pta

## 2014-06-18 NOTE — ED Provider Notes (Signed)
CSN: [id number]     Arrival date & time 06/18/14  1911 History   First MD Initiated Contact with Patient 06/18/14 1923     Chief Complaint  Patient presents with  . Fall     (Consider location/radiation/quality/duration/timing/severity/associated sxs/prior Treatment) HPI  39 year old male presents with acute low back pain that started about one hour prior to arrival. He states that he was walking and suddenly his "back gave out" and he slipped and fell to the ground. He did not land on his back. He did not hit his head. This feels different than his typical kidney stone pain. No hematuria or dysuria. Rates his pain as severe. He ate "a hand full of ibuprofen" without relief. No weakness or numbness in his legs. Has some pain in his right buttocks but the pain does not radiate further than that. No saddle anesthesia or bowel/bladder incontinence.  Past Medical History  Diagnosis Date  . Kidney stones   . Kidney stone    Past Surgical History  Procedure Laterality Date  . Appendectomy    . Shoulder surgery    . Hernia repair     No family history on file. History  Substance Use Topics  . Smoking status: Current Every Day Smoker -- 1.00 packs/day    Types: Cigarettes  . Smokeless tobacco: Never Used     Comment: Sts working on it - "going back on my vapor"  . Alcohol Use: No    Review of Systems  Gastrointestinal: Negative for vomiting and abdominal pain.  Genitourinary: Negative for dysuria and hematuria.  Musculoskeletal: Positive for back pain.  Neurological: Negative for weakness and numbness.  All other systems reviewed and are negative.     Allergies  Ciprofloxacin; Tape; Tramadol hcl; and Vicodin  Home Medications   Prior to Admission medications   Medication Sig Start Date End Date Taking? Authorizing Provider  baclofen (LIORESAL) 10 MG tablet Take 10 mg by mouth Nightly.   Yes Historical Provider, MD  escitalopram (LEXAPRO) 20 MG tablet Take 20 mg by mouth  daily.   Yes Historical Provider, MD  ibuprofen (ADVIL,MOTRIN) 800 MG tablet Take 1 tablet (800 mg total) by mouth every 8 (eight) hours as needed for mild pain. 05/19/14  Yes Kristen N Ward, DO  ondansetron (ZOFRAN ODT) 4 MG disintegrating tablet Take 1 tablet (4 mg total) by mouth every 8 (eight) hours as needed for nausea or vomiting. 05/19/14  Yes Kristen N Ward, DO  simvastatin (ZOCOR) 20 MG tablet Take 20 mg by mouth daily.   Yes Historical Provider, MD  cyclobenzaprine (FLEXERIL) 10 MG tablet Take 1 tablet (10 mg total) by mouth 3 (three) times daily as needed for muscle spasms. 06/18/14   Pricilla Loveless, MD  oxyCODONE-acetaminophen (PERCOCET) 5-325 MG per tablet Take 1-2 tablets by mouth every 6 (six) hours as needed for severe pain. 06/18/14   Pricilla Loveless, MD  predniSONE (DELTASONE) 20 MG tablet 3 tabs po daily x 3 days, then 2 tabs x 3 days, then 1.5 tabs x 3 days, then 1 tab x 3 days, then 0.5 tabs x 3 days 05/28/14   Gilda Crease, MD   BP 116/76 mmHg  Pulse 73  Temp(Src) 98.4 F (36.9 C) (Oral)  Resp 18  Ht  (1.702 m)  Wt 160 lb (72.576 kg)  BMI 25.05 kg/m2  SpO2 99% Physical Exam  Constitutional: He is oriented to person, place, and time. He appears well-developed and well-nourished.  HENT:  Head: Normocephalic  and atraumatic.  Right Ear: External ear normal.  Left Ear: External ear normal.  Nose: Nose normal.  Eyes: Right eye exhibits no discharge. Left eye exhibits no discharge.  Neck: Neck supple.  Pulmonary/Chest: Effort normal.  Abdominal: He exhibits no distension.  Musculoskeletal: He exhibits no edema.       Lumbar back: He exhibits tenderness.  Neurological: He is alert and oriented to person, place, and time.  Reflex Scores:      Patellar reflexes are 2+ on the right side and 2+ on the left side.      Achilles reflexes are 2+ on the right side and 2+ on the left side. 5/5 strength in bilateral lower extremities. Normal gross sensation. Able to  ambulate normally but with pain  Skin: Skin is warm and dry.  Nursing note and vitals reviewed.   ED Course  Procedures (including critical care time) Labs Review Labs Reviewed - No data to display  Imaging Review No results found.   EKG Interpretation None      MDM   Final diagnoses:  Midline low back pain without sciatica    Patient with acute onset of low back pain. Atraumatic. Normal neurologic exam and no bowel or bladder incontinence. I have very low suspicion for an acute spinal emergency. No CVA tenderness or urinary symptoms to suggest this is a renal/ureter source. Will treat with oral pain medicine, back exercises, and recommend follow-up with PCP.    Pricilla LovelessScott Teona Vargus, MD 06/18/14 2052

## 2014-06-23 ENCOUNTER — Emergency Department (HOSPITAL_BASED_OUTPATIENT_CLINIC_OR_DEPARTMENT_OTHER): Payer: Medicaid Other

## 2014-06-23 ENCOUNTER — Encounter (HOSPITAL_BASED_OUTPATIENT_CLINIC_OR_DEPARTMENT_OTHER): Payer: Self-pay

## 2014-06-23 ENCOUNTER — Emergency Department (HOSPITAL_BASED_OUTPATIENT_CLINIC_OR_DEPARTMENT_OTHER)
Admission: EM | Admit: 2014-06-23 | Discharge: 2014-06-23 | Disposition: A | Discharge status: Home / Self Care | Payer: Medicaid Other | Attending: Emergency Medicine | Admitting: Emergency Medicine

## 2014-06-23 DIAGNOSIS — Y9389 Activity, other specified: Secondary | ICD-10-CM | POA: Diagnosis not present

## 2014-06-23 DIAGNOSIS — Y9289 Other specified places as the place of occurrence of the external cause: Secondary | ICD-10-CM | POA: Insufficient documentation

## 2014-06-23 DIAGNOSIS — S3992XA Unspecified injury of lower back, initial encounter: Secondary | ICD-10-CM | POA: Diagnosis not present

## 2014-06-23 DIAGNOSIS — Z720 Tobacco use: Secondary | ICD-10-CM | POA: Insufficient documentation

## 2014-06-23 DIAGNOSIS — Z87442 Personal history of urinary calculi: Secondary | ICD-10-CM | POA: Diagnosis not present

## 2014-06-23 DIAGNOSIS — Z79899 Other long term (current) drug therapy: Secondary | ICD-10-CM | POA: Diagnosis not present

## 2014-06-23 DIAGNOSIS — W19XXXA Unspecified fall, initial encounter: Secondary | ICD-10-CM

## 2014-06-23 DIAGNOSIS — Y998 Other external cause status: Secondary | ICD-10-CM | POA: Diagnosis not present

## 2014-06-23 DIAGNOSIS — M545 Low back pain: Secondary | ICD-10-CM

## 2014-06-23 DIAGNOSIS — G8929 Other chronic pain: Secondary | ICD-10-CM

## 2014-06-23 DIAGNOSIS — M549 Dorsalgia, unspecified: Secondary | ICD-10-CM

## 2014-06-23 DIAGNOSIS — W01198A Fall on same level from slipping, tripping and stumbling with subsequent striking against other object, initial encounter: Secondary | ICD-10-CM | POA: Diagnosis not present

## 2014-06-23 HISTORY — DX: Dorsalgia, unspecified: M54.9

## 2014-06-23 MED ORDER — OXYCODONE-ACETAMINOPHEN 5-325 MG PO TABS
1.0000 | ORAL_TABLET | Freq: Once | ORAL | Status: AC
  Administered 2014-06-23: 1 via ORAL
  Filled 2014-06-23: qty 1

## 2014-06-23 MED ORDER — OXYCODONE-ACETAMINOPHEN 5-325 MG PO TABS
1.0000 | ORAL_TABLET | Freq: Three times a day (TID) | ORAL | Status: AC | PRN
Start: 2014-06-23 — End: ?

## 2014-06-23 NOTE — ED Notes (Signed)
C/o fall x 2 yesterday-states "back gave out"-states he hit his head with one of the two falls-? LOC but reports no recall-dizziness x this am-pt with steady gait into triage

## 2014-06-23 NOTE — ED Provider Notes (Signed)
CSN: [id number]     Arrival date & time 06/23/14  1244 History   First MD Initiated Contact with Patient 06/23/14 1333     Chief Complaint  Patient presents with  . Fall     (Consider location/radiation/quality/duration/timing/severity/associated sxs/prior Treatment) The history is provided by the patient. No language interpreter was used.  Kyle Franklin is a 39 year old male past medical history of chronic back pain, kidney stones presenting to the ED with acute exacerbation of chronic back pain resulting in fall 2 yesterday. 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 Reported that he was placed in pain management for approximate 6-7 months ago but was let go by his pain management approximate 2 months ago. Patient reported that his back pain is a constant sharp shooting pain as if a "bat" is being hit to his back. Reported that it is worse with sitting and standing for long periods of time. Reported that the discomfort radiates down his right leg resulting in intermittent tingling. Stated that this pain does been ongoing for approximate 4-5 years. Reported that yesterday at approximate 4:00 PM he had sudden onset of back pain that resulted in him to fall down-denied hitting his head or injury towards legs are back. Reported that approximately 11:30 PM he had sudden onset of back pain again which resulted him to fall over boxes and hit is head on the edge of the freezer-reported that he believes he has mild loss of consciousness, but stated that he got up and new lobes: On around him. Patient reported that he has been concerned secondary to hitting his head yesterday and been having dizzy spells today. Patient reported that the only thing he ate yesterday was a bolus cereal at approximate 9:00 PM and has not eaten anything today. Denied loss of sensation, trauma to the  back, foot drop, weakness, surgery, chest pain, shortness of breath, difficulty breathing, abdominal pain, nausea, vomiting, urinary bowel incontinence, saddle paresthesias, blurred vision, sudden loss of vision, neck pain, neck stiffness. Denied blood thinners.  PCP none  Past Medical History  Diagnosis Date  . Kidney stones   . Kidney stone   . Back pain    Past Surgical History  Procedure Laterality Date  . Appendectomy    . Shoulder surgery    . Hernia repair     No family history on file. History  Substance Use Topics  . Smoking status: Current Every Day Smoker -- 1.00 packs/day    Types: Cigarettes  . Smokeless tobacco: Never Used  . Alcohol Use: No    Review of Systems  Constitutional: Negative for fever and chills.  Eyes: Negative for visual disturbance.  Respiratory: Negative for chest tightness and shortness of breath.   Cardiovascular: Negative for chest pain.  Gastrointestinal: Negative for nausea, vomiting, abdominal pain and diarrhea.  Musculoskeletal: Positive for back pain. Negative for neck pain and neck stiffness.  Neurological: Positive for dizziness. Negative for weakness, numbness and headaches.      Allergies  Ciprofloxacin; Tape; Tramadol hcl; and Vicodin  Home Medications   Prior to Admission medications   Medication Sig Start Date End Date Taking? Authorizing Provider  baclofen (LIORESAL) 10 MG tablet Take 10 mg by mouth Nightly.    Historical Provider, MD  cyclobenzaprine (FLEXERIL) 10 MG tablet Take 1 tablet (10 mg total) by mouth 3 (three) times daily as needed  for muscle spasms. 06/18/14   Pricilla Loveless, MD  escitalopram (LEXAPRO) 20 MG tablet Take 20 mg by mouth daily.    Historical Provider, MD  ibuprofen (ADVIL,MOTRIN) 800 MG tablet Take 1 tablet (800 mg total) by mouth every 8 (eight) hours as needed for mild pain. 05/19/14   Kristen N Ward, DO  ondansetron (ZOFRAN ODT) 4 MG disintegrating tablet Take 1 tablet (4 mg total) by mouth every  8 (eight) hours as needed for nausea or vomiting. 05/19/14   Kristen N Ward, DO  oxyCODONE-acetaminophen (PERCOCET/ROXICET) 5-325 MG per tablet Take 1 tablet by mouth every 8 (eight) hours as needed for severe pain. 06/23/14   Rajohn Henery, PA-C  simvastatin (ZOCOR) 20 MG tablet Take 20 mg by mouth daily.    Historical Provider, MD   BP 120/71 mmHg  Pulse 76  Temp(Src) 97.9 F (36.6 C) (Oral)  Resp 16  Ht  (1.702 m)  Wt 160 lb (72.576 kg)  BMI 25.05 kg/m2  SpO2 100% Physical Exam  Constitutional: He is oriented to person, place, and time. He appears well-developed and well-nourished. No distress.  HENT:  Head: Normocephalic and atraumatic.  Mouth/Throat: Oropharynx is clear and moist. No oropharyngeal exudate.  Eyes: Conjunctivae and EOM are normal. Pupils are equal, round, and reactive to light. Right eye exhibits no discharge. Left eye exhibits no discharge.  Neck: Normal range of motion. Neck supple. No tracheal deviation present.  Cardiovascular: Normal rate, regular rhythm and normal heart sounds.  Exam reveals no friction rub.   No murmur heard. Pulmonary/Chest: Effort normal and breath sounds normal. No respiratory distress. He has no wheezes. He has no rales.  Musculoskeletal: He exhibits tenderness.       Lumbar back: He exhibits tenderness. He exhibits normal range of motion, no bony tenderness, no swelling, no edema, no deformity and no laceration.       Back:  Negative deformities or malalignments identified to the spine. Tenderness upon palpation to the mid lumbar and paravertebral regions bilaterally. Crease range of motion to right lower tremors secondary to pain-this is been ongoing since beginning of back pain 4-5 years ago.  Lymphadenopathy:    He has no cervical adenopathy.  Neurological: He is alert and oriented to person, place, and time. No cranial nerve deficit. He exhibits normal muscle tone. Coordination normal.  Cranial nerves III-XII grossly  intact Strength 5+/5+ to upper and lower extremities bilaterally with resistance applied, equal distribution noted Equal grip strength bilaterally Negative saddle paresthesias bilaterally sensation intact with differentiation sharp and dull touch Patient stable to bring finger to nose bilaterally without difficulty or ataxia Gait proper, proper balance - negative sway, negative drift, negative step-offs  Skin: Skin is warm and dry. No rash noted. He is not diaphoretic. No erythema.  Psychiatric: He has a normal mood and affect. His behavior is normal. Thought content normal.  Nursing note and vitals reviewed.   ED Course  Procedures (including critical care time) Labs Review Labs Reviewed - No data to display  Imaging Review Dg Lumbar Spine Complete  06/23/2014   CLINICAL DATA:  Low back pain  EXAM: LUMBAR SPINE - COMPLETE 4+ VIEW  COMPARISON:  CT abdomen pelvis 01/04/2013  FINDINGS: Mild disc space narrowing L4-5 and L3-4 as noted on the CT. Remaining disc spaces intact.  Normal alignment. Negative for fracture or mass. Negative for pars defect.  IMPRESSION: Mild disc space narrowing L3-4 and L4-5.  No acute abnormality.   Electronically Signed   By:  Marlan Palau M.D.   On: 06/23/2014 15:09   Ct Head Wo Contrast  06/23/2014   CLINICAL DATA:  Patient fell twice yesterday, hit his head on 1 of the falls with possible loss of consciousness  EXAM: CT HEAD WITHOUT CONTRAST  CT CERVICAL SPINE WITHOUT CONTRAST  TECHNIQUE: Multidetector CT imaging of the head and cervical spine was performed following the standard protocol without intravenous contrast. Multiplanar CT image reconstructions of the cervical spine were also generated.  COMPARISON:  None.  FINDINGS: CT HEAD FINDINGS  No mass lesion. No midline shift. No acute hemorrhage or hematoma. No extra-axial fluid collections. No evidence of acute infarction. No skull fracture. There is partial opacification of the right mastoid air cells.  CT  CERVICAL SPINE FINDINGS  No paraspinous soft tissue abnormalities. Reversed lordosis. No fracture. Mild C5-6 and C6-7 degenerative disc disease.  IMPRESSION: 1. Right mastoid effusion. No skull fracture. No acute intracranial abnormalities. 2. No cervical spine fracture   Electronically Signed   By: Esperanza Heir M.D.   On: 06/23/2014 15:08   Ct Cervical Spine Wo Contrast  06/23/2014   CLINICAL DATA:  Patient fell twice yesterday, hit his head on 1 of the falls with possible loss of consciousness  EXAM: CT HEAD WITHOUT CONTRAST  CT CERVICAL SPINE WITHOUT CONTRAST  TECHNIQUE: Multidetector CT imaging of the head and cervical spine was performed following the standard protocol without intravenous contrast. Multiplanar CT image reconstructions of the cervical spine were also generated.  COMPARISON:  None.  FINDINGS: CT HEAD FINDINGS  No mass lesion. No midline shift. No acute hemorrhage or hematoma. No extra-axial fluid collections. No evidence of acute infarction. No skull fracture. There is partial opacification of the right mastoid air cells.  CT CERVICAL SPINE FINDINGS  No paraspinous soft tissue abnormalities. Reversed lordosis. No fracture. Mild C5-6 and C6-7 degenerative disc disease.  IMPRESSION: 1. Right mastoid effusion. No skull fracture. No acute intracranial abnormalities. 2. No cervical spine fracture   Electronically Signed   By: Esperanza Heir M.D.   On: 06/23/2014 15:08     EKG Interpretation None      MDM   Final diagnoses:  Back pain  Acute exacerbation of chronic low back pain  Fall, initial encounter    Medications  oxyCODONE-acetaminophen (PERCOCET/ROXICET) 5-325 MG per tablet 1 tablet (1 tablet Oral Given 06/23/14 1449)    Filed Vitals:   06/23/14 1258 06/23/14 1533  BP: 120/82 120/71  Pulse: 95 76  Temp: 97.9 F (36.6 C)   TempSrc: Oral   Resp: 20 16  Height:  (1.702 m)   Weight: 160 lb (72.576 kg)   SpO2: 100% 100%   This provider reviewed patient's  chart. Patient was seen and assessed in ED setting on 06/18/2014 regarding "back gave out" resulting in a fall. Imaging was not performed at this time and patient was discharged home on pain medications. Back pain appears to be a chronic issue, patient presenting to the ED with acute exacerbation of chronic back pain. Patient used to be in pain management, but no longer secondary to being discontinued by the pain management specialist. Patient has been out of pain medications for approximately 2 months when placed in pain management for approximately 6-7 months. CT head without contrast negative for skull fracture or acute adrenal processes. CT cervical spine without contrast unremarkable. Plain film of lumbar spine noted mild disc space narrowing at L3-4 and L4-5, no acute abnormalities identified. Imaging unremarkable for acute abnormalities. Negative  focal neurological deficits identified. Strength intact with equal distribution. Negative saddle paresthesias. Gait proper-negative step-offs or sway. Doubt cauda equina. Doubt epidural abscess. Suspicion to be acute exacerbation of chronic back pain-patient is been dealing with back pain for approximately 4-5 years. Patient stable, afebrile. Patient not septic appearing. Negative signs of respiratory distress. Discharged patient. [facility] - reported that patient may need a MRI of the lumbar spine within the near future. Discussed with patient to avoid any physical or strenuous activity. Discussed with patient to stop heavy lifting. Discussed with patient that he needs to follow up and get involved with a pain management specialist. Discharged patient. Discharge patient with a small dose of pain medications-discussed course, precautions, disposal technique. Discussed with patient to closely monitor symptoms and if symptoms are to worsen or change to report back to the ED - strict return instructions given.   Patient agreed to plan of care, understood, all questions answered.   Raymon MuttonMarissa Taheem Fricke, PA-C 06/23/14 1607  Purvis SheffieldForrest Harrison, MD 06/23/14 1622

## 2014-06-23 NOTE — ED Notes (Signed)
Pt requesting pain medication.  EDP made aware.  

## 2014-06-23 NOTE — Discharge Instructions (Signed)
Please call your doctor for a followup appointment within 24-48 hours. When you talk to your doctor please let them know that you were seen in the emergency department and have them acquire all of your records so that they can discuss the findings with you and formulate a treatment plan to fully care for your new and ongoing problems. Please follow-up with health and wellness Center Please avoid any physical or strenuous activity Please no heavy lifting Please follow-up with orthopedics Please get involved in finding a pain management specialist for chronic back pain Please apply warm compressions and massage with icy hot ointment Please continue to monitor symptoms closely and if symptoms are to worsen or change (fever greater than 101, chills, sweating, nausea, vomiting, chest pain, shortness of breathe, difficulty breathing, weakness, numbness, tingling, worsening or changes to pain pattern, fall, trauma to the back, inability to control urine or bowel movements, tingling in between the thighs, foot drop, loss of sensation to legs) please report back to the Emergency Department immediately.   Chronic Back Pain  When back pain lasts longer than 3 months, it is called chronic back pain.People with chronic back pain often go through certain periods that are more intense (flare-ups).  CAUSES Chronic back pain can be caused by wear and tear (degeneration) on different structures in your back. These structures include:  The bones of your spine (vertebrae) and the joints surrounding your spinal cord and nerve roots (facets).  The strong, fibrous tissues that connect your vertebrae (ligaments). Degeneration of these structures may result in pressure on your nerves. This can lead to constant pain. HOME CARE INSTRUCTIONS  Avoid bending, heavy lifting, prolonged sitting, and activities which make the problem worse.  Take brief periods of rest throughout the day to reduce your pain. Lying down or  standing usually is better than sitting while you are resting.  Take over-the-counter or prescription medicines only as directed by your caregiver. SEEK IMMEDIATE MEDICAL CARE IF:   You have weakness or numbness in one of your legs or feet.  You have trouble controlling your bladder or bowels.  You have nausea, vomiting, abdominal pain, shortness of breath, or fainting. Document Released: 03/30/2004 Document Revised: 05/15/2011 Document Reviewed: 02/04/2011 [facility] Patient Information 2015 [place], [place]. This information is not intended to replace advice given to you by your health care provider. Make sure you discuss any questions you have with your health care provider.   Emergency Department Resource Guide 1) Find a Doctor and Pay Out of Pocket Although you won't have to find out who is covered by your insurance plan, it is a good idea to ask around and get recommendations. You will then need to call the office and see if the doctor you have chosen will accept you as a new patient and what types of options they offer for patients who are self-pay. Some doctors offer discounts or will set up payment plans for their patients who do not have insurance, but you will need to ask so you aren't surprised when you get to your appointment.  2) Contact Your Local Health Department Not all health departments have doctors that can see patients for sick visits, but many do, so it is worth a call to see if yours does. If you don't know where your local health department is, you can check in your phone book. The CDC also has a tool to help you locate your state's health department, and many state websites also have listings of all of  their local health departments.  3) Find a Walk-in Clinic If your illness is not likely to be very severe or complicated, you may want to try a walk in clinic. These are popping up all over the country in pharmacies, drugstores, and shopping centers. They're usually staffed  by nurse practitioners or physician assistants that have been trained to treat common illnesses and complaints. They're usually fairly quick and inexpensive. However, if you have serious medical issues or chronic medical problems, these are probably not your best option.  No Primary Care Doctor: - Call Health Connect at  [phone] - they can help you locate a primary care doctor that  accepts your insurance, provides certain services, etc. - Physician Referral Service- [phone]  Chronic Pain Problems: Organization         Address  Phone   Notes  [facility]  [phone] Patients need to be referred by their primary care doctor.   Medication Assistance: Organization         Address  Phone   Notes  [facility] Medication [facility] [address] [place] [place], [place] [address] [phone] --Must be a resident of [facility] -- Must have NO insurance coverage whatsoever (no Medicaid/ Medicare, etc.) -- The pt. MUST have a primary care doctor that directs their care regularly and follows them in the community   [facility]  [phone]   [employer]  [phone]    Agencies that provide inexpensive medical care: Organization         Address  Phone   Notes  [facility]  [phone]   [facility]    [phone]   [facility] [address] [place], [place] [address] [phone]   [facility] [address] [place] [phone]   Planned Parenthood    [phone]   [facility]    [phone]   [facility]  [address], [place] Phone:  [phone], Fax:  [phone] Hours of Operation:  9 am - 6 pm, M-F.  Also accepts Medicaid/Medicare and self-pay.  [facility]  [address], [place] Phone: [phone], Fax: [phone]. Hours of Operation:   8:30 am - 5:30 pm, M-F.  Also accepts Medicaid and self-pay.  [facility] [place] [address], [place] Phone: [phone]   [facility] [address] Natasha Bence [place], [place] [phone], Ext. [phone] Mondays & Thursdays: 7-9 AM.  First 15 patients are seen on a first come, first serve basis.    Medicaid-accepting [facility] Providers:  Organization         Address  Phone   Notes  [facility] [address], [place] [phone] Also accepts self-pay patients.  [facility] [address] Laurell Josephs [place], [place]  [phone]   [facility] [address], [place] [phone]   [facility] [address], [place] [phone]   Renaye Rakers [address], [place]   [phone] Only accepts [place] Access [place] patients after they have their name applied to their card.   Self-Pay (no insurance) in [facility]:  Organization         Address  Phone   Notes  Sickle Cell  Patients, [facility] [address], [place] [phone]) [phone]   [facility] [address], [place] [phone]) [phone]   [facility]  [address], [place] [phone]   [facility]/Dr. Osei-Bonsu  [address] [place] [address], [place] or [phone], [place] [phone] Phone number for both [place] and [place] locations is the same.  [facility] [address], [place] [phone]) [phone]   [facility] [place] [address] [place] [address], [place] or [address] [phone]   [facility] [address], [place] [phone], phone; [phone]) [phone], fax Sees patients 1st and 3rd Saturday of every month.  Must not qualify for public or private insurance (i.e. Medicaid, Medicare, [place] Health  Choice, Veterans' Benefits)  Household income should be no more than 200% of the poverty level The clinic cannot treat you if you are pregnant or think you are pregnant  Sexually transmitted diseases are not treated at the clinic.    Dental Care: Organization         Address  Phone  Notes  [facility] [address], [place] [phone]) [phone] Accepts children up to age 41 who are enrolled in [place] or [place] Health Choice; pregnant women with a Medicaid card; and children who have applied for Medicaid or [place] Health Choice, but were declined, whose parents can pay a reduced fee at time of service.  [facility] [place]  [address], [place] [phone] Accepts children up to age 41 who are enrolled in [place] or [place] Health Choice; pregnant women with a Medicaid card; and children who have applied for Medicaid or  Health Choice, but were declined, whose parents can pay a reduced fee at time of service.  [facility]  [address], [place] [phone]) [phone] Patients are seen by appointment only. Walk-ins are not accepted. [facility] will see patients 41 years of age and older. Monday - Tuesday (8am-5pm) Most Wednesdays (8:30-5pm) $30 per visit, cash only  [facility]  [address], [place] [facility] [place] [phone]) [phone] Patients are seen by appointment only. Walk-ins are not accepted. [facility] will see patients 41 years of age and older. One Wednesday Evening (Monthly: Volunteer Based).  $30 per visit, cash only  [employer] [facility] [facility]  [phone] for adults; Children under age 264, call Graduate Pediatric Dentistry at [phone]. Children aged 284-14, please call [phone] to request a pediatric application.  Dental services are provided in all areas of dental care including fillings, crowns and bridges, complete  and partial dentures, implants, gum treatment, root canals, and extractions. Preventive care is also provided. Treatment is provided to both adults and children. Patients are selected via a lottery and there is often a waiting list.   [facility] [address], [place]  [phone]) [phone] [email or web address]   [facility] [address], [place], [place] [phone], Ext. [phone] Second and Fourth Thursday of each month, opens at 6:30 AM; Clinic ends at 9 AM.  Patients are seen on a first-come first-served basis, and a limited number are seen during each clinic.   [facility]  [address], [place], [place] [phone]   Eligibility Requirements You  must have lived in [place], [place], or [place] counties for at least the last three months.   You cannot be eligible for state or federal sponsored [place], including [employer], [place], or [employer].   You generally cannot be eligible for healthcare insurance through your employer.    How to apply: Eligibility screenings are held every Tuesday and Wednesday afternoon from 1:00 pm until 4:00 pm. You do not need an appointment for the interview!  [facility] [address] [place], [place] [phone]   [facility] Health Department  [phone]   [facility] Health Department  [phone]   [facility] Health Department  [phone]    Behavioral Health Resources in the Community: Intensive Outpatient Programs Organization         Address  Phone  Notes  [facility] [address] [place], [place] [phone]   [facility] Outpatient [address] [place], [place] [phone]   [facility] [address], [place], [place]  [phone]   [facility] Mental Health [address],  [place], [place] [phone] or [phone]   Substance Abuse Resources Organization          Address  Phone  Notes  [facility] Drug Services  [phone]   [facility]  [phone]   [facility]  [phone]   Floydene Flock  [phone]   Residential & Outpatient Substance Abuse Program  [phone]   Psychological Services Organization         Address  Phone  Notes  [facility] Behavioral Health  [phone]   [facility]  [phone]   [facility] [address] [place] [phone] or [phone]    Mobile Crisis Teams Organization         Address  Phone  Notes  [facility], Mobile Crisis Care Unit  [phone]   [facility]  [address] [place], [place] [phone]   Doristine Locks [address] [place] [phone]    Self-Help/Support Groups Organization         Address  Phone             Notes  [facility]oc[facility] of [place] - variety of support groups  336- I7437963 Call for more information  Narcotics Anonymous (NA), [facility] [address], [employer] [place]  2 meetings at this location   Statistician         Address  Phone  Notes  [facility] [address],    [place]  [phone]   [facility]  [address] [place] [address], [place], [place] [phone]   [facility] [address] [place], [place] [phone] Admissions: 8am-3pm M-F  [facility] [address]    [place], [place] [phone]   [facility] [address] [place], [place], [place] [phone]   [facility] [address],  [place], [place] [phone]   [facility] - Intensive Outpatient [address]ure[address], [place], [place] [phone]   [facility] ([facility] [address] [place]  [place], [place] [phone] or [phone]   [facility] ([facility]) [address], [place], [place]  [phone] Accepts Medicaid  [facility] [address],  [place] [phone] Substance Abuse/Addiction Treatment   [facility] Resources Organization         Address  Phone  Notes  [facility]  [phone]   Domenic Schwab, PhD [address] Arlis Porta [place], [place]   [phone] or [phone]   [place], [place] [phone]   [address], [place], [place] [phone] Insurance/Medicaid/sponsorship through [facility] [address]                                    [place], [place] [phone] [place] [address], [place] [phone]    Dr. Adele Schilder  [phone]   [facility] Dept. 1) [address] [place] [address] [place] 2) [place] 3)  [place], [place] [phone]  [phone]   [place] [phone] or [phone] (After Hours)

## 2014-06-23 NOTE — ED Notes (Signed)
PA at bedside.

## 2014-06-23 NOTE — ED Notes (Signed)
Pt reports dizziness. Sts he doesn't have any more medication from last week. Sts fall x 2 since last visit.

## 2014-07-16 ENCOUNTER — Emergency Department (HOSPITAL_BASED_OUTPATIENT_CLINIC_OR_DEPARTMENT_OTHER): Payer: Medicaid Other

## 2014-07-16 ENCOUNTER — Encounter (HOSPITAL_BASED_OUTPATIENT_CLINIC_OR_DEPARTMENT_OTHER): Payer: Self-pay | Admitting: *Deleted

## 2014-07-16 ENCOUNTER — Emergency Department (HOSPITAL_BASED_OUTPATIENT_CLINIC_OR_DEPARTMENT_OTHER)
Admission: EM | Admit: 2014-07-16 | Discharge: 2014-07-16 | Disposition: A | Discharge status: Home / Self Care | Payer: Medicaid Other | Attending: Emergency Medicine | Admitting: Emergency Medicine

## 2014-07-16 DIAGNOSIS — Y9389 Activity, other specified: Secondary | ICD-10-CM | POA: Diagnosis not present

## 2014-07-16 DIAGNOSIS — Y9289 Other specified places as the place of occurrence of the external cause: Secondary | ICD-10-CM | POA: Insufficient documentation

## 2014-07-16 DIAGNOSIS — Z87442 Personal history of urinary calculi: Secondary | ICD-10-CM | POA: Insufficient documentation

## 2014-07-16 DIAGNOSIS — Y998 Other external cause status: Secondary | ICD-10-CM | POA: Insufficient documentation

## 2014-07-16 DIAGNOSIS — Z79899 Other long term (current) drug therapy: Secondary | ICD-10-CM | POA: Insufficient documentation

## 2014-07-16 DIAGNOSIS — Z720 Tobacco use: Secondary | ICD-10-CM | POA: Diagnosis not present

## 2014-07-16 DIAGNOSIS — W108XXA Fall (on) (from) other stairs and steps, initial encounter: Secondary | ICD-10-CM | POA: Diagnosis not present

## 2014-07-16 DIAGNOSIS — S6991XA Unspecified injury of right wrist, hand and finger(s), initial encounter: Secondary | ICD-10-CM | POA: Insufficient documentation

## 2014-07-16 DIAGNOSIS — M79641 Pain in right hand: Secondary | ICD-10-CM

## 2014-07-16 NOTE — ED Provider Notes (Signed)
CSN: [id number]     Arrival date & time 07/16/14  [id number] History   First MD Initiated Contact with Patient 07/16/14 1150     Chief Complaint  Patient presents with  . Hand Injury    right     (Consider location/radiation/quality/duration/timing/severity/associated sxs/prior Treatment) Patient is a 39 y.o. male presenting with hand injury.  Hand Injury Location:  Hand Time since incident: 18 hours. Injury: yes   Mechanism of injury: fall   Fall:    Fall occurred:  Down stairs Hand location:  R hand Pain details:    Quality:  Throbbing   Radiates to:  Does not radiate   Severity:  Severe   Onset quality:  Gradual   Timing:  Constant   Progression:  Worsening Chronicity:  New Handedness:  Right-handed Relieved by:  Nothing Worsened by:  Movement Associated symptoms: swelling   Associated symptoms: no neck pain, no numbness and no tingling   Associated symptoms comment:  Chest wall pain from same fall, evaluated at an OSH yesterday.    Past Medical History  Diagnosis Date  . Kidney stones   . Kidney stone   . Back pain    Past Surgical History  Procedure Laterality Date  . Appendectomy    . Shoulder surgery    . Hernia repair     No family history on file. History  Substance Use Topics  . Smoking status: Current Every Day Smoker -- 1.00 packs/day    Types: Cigarettes  . Smokeless tobacco: Never Used  . Alcohol Use: No    Review of Systems  Musculoskeletal: Negative for neck pain.  All other systems reviewed and are negative.     Allergies  Vicodin; Ciprofloxacin; Tape; and Tramadol hcl  Home Medications   Prior to Admission medications   Medication Sig Start Date End Date Taking? Authorizing Provider  escitalopram (LEXAPRO) 20 MG tablet Take 20 mg by mouth daily.   Yes Historical Provider, MD  simvastatin (ZOCOR) 20 MG tablet Take 20 mg by mouth daily.   Yes Historical Provider, MD  baclofen (LIORESAL) 10 MG tablet Take 10 mg by mouth Nightly.     Historical Provider, MD  cyclobenzaprine (FLEXERIL) 10 MG tablet Take 1 tablet (10 mg total) by mouth 3 (three) times daily as needed for muscle spasms. 06/18/14   Pricilla LovelessScott Goldston, MD  ibuprofen (ADVIL,MOTRIN) 800 MG tablet Take 1 tablet (800 mg total) by mouth every 8 (eight) hours as needed for mild pain. 05/19/14   Kristen N Ward, DO  ondansetron (ZOFRAN ODT) 4 MG disintegrating tablet Take 1 tablet (4 mg total) by mouth every 8 (eight) hours as needed for nausea or vomiting. 05/19/14   Kristen N Ward, DO  oxyCODONE-acetaminophen (PERCOCET/ROXICET) 5-325 MG per tablet Take 1 tablet by mouth every 8 (eight) hours as needed for severe pain. 06/23/14   [name] Sciacca, PA-C   BP 125/87 mmHg  Pulse 65  Temp(Src) 97.8 F (36.6 C) (Oral)  Resp 18  Ht 5\' 7"  (1.702 m)  Wt 165 lb (74.844 kg)  BMI 25.84 kg/m2  SpO2 100% Physical Exam  Constitutional: He is oriented to person, place, and time. He appears well-developed and well-nourished. No distress.  HENT:  Head: Normocephalic and atraumatic.  Eyes: Conjunctivae are normal. No scleral icterus.  Neck: Neck supple.  Cardiovascular: Normal rate and intact distal pulses.   Pulmonary/Chest: Effort normal. No stridor. No respiratory distress.  Abdominal: Normal appearance. He exhibits no distension.  Musculoskeletal:  Mild swelling to  right hand.  TTP of 4th MCP joint.  No snuffbox pain.  Good motor, sensation, and cap refill.    Neurological: He is alert and oriented to person, place, and time.  Skin: Skin is warm and dry. No rash noted.  Psychiatric: He has a normal mood and affect. His behavior is normal.  Nursing note and vitals reviewed.   ED Course  Procedures (including critical care time) Labs Review Labs Reviewed - No data to display  Imaging Review Dg Hand Complete Right  07/16/2014   CLINICAL DATA:  39 year old male post fall. Pain fifth metacarpal. Initial encounter.  EXAM: RIGHT HAND - COMPLETE 3+ VIEW  COMPARISON:  None.   FINDINGS: No fracture or dislocation.  Mild soft tissue prominence may be related to habitus although soft tissue injury not excluded.  IMPRESSION: No fracture or dislocation.  Mild soft tissue prominence may be related to habitus although soft tissue injury not excluded.   Electronically Signed   By: Lacy DuverneySteven  Olson M.D.   On: 07/16/2014 11:16  All radiology studies independently viewed by me.      EKG Interpretation None      MDM   Final diagnoses:  Right hand pain    39 yo male presenting with complaint of hand pain after a fall last night.  He states he was evaluated for this fall last night at another ED.  However, he woke up this morning and his right hand was hurting.  That's why he's here today.  Plain film is negative.  Advised NSAIDs, ice, elevated.      Blake DivineJohn Jacon Whetzel, MD 07/16/14 [phone]

## 2014-07-16 NOTE — ED Notes (Signed)
Patient states he fell down several steps last night.  [facility]  States he woke up this morning with pain in his right hand.

## 2014-10-19 ENCOUNTER — Emergency Department (HOSPITAL_BASED_OUTPATIENT_CLINIC_OR_DEPARTMENT_OTHER)
Admission: EM | Admit: 2014-10-19 | Discharge: 2014-10-19 | Disposition: A | Discharge status: Home / Self Care | Payer: Medicaid Other | Attending: Emergency Medicine | Admitting: Emergency Medicine

## 2014-10-19 ENCOUNTER — Encounter (HOSPITAL_BASED_OUTPATIENT_CLINIC_OR_DEPARTMENT_OTHER): Payer: Self-pay | Admitting: *Deleted

## 2014-10-19 ENCOUNTER — Emergency Department (HOSPITAL_BASED_OUTPATIENT_CLINIC_OR_DEPARTMENT_OTHER): Payer: Medicaid Other

## 2014-10-19 DIAGNOSIS — Z87442 Personal history of urinary calculi: Secondary | ICD-10-CM | POA: Diagnosis not present

## 2014-10-19 DIAGNOSIS — Z720 Tobacco use: Secondary | ICD-10-CM | POA: Insufficient documentation

## 2014-10-19 DIAGNOSIS — M25521 Pain in right elbow: Secondary | ICD-10-CM | POA: Insufficient documentation

## 2014-10-19 DIAGNOSIS — M79631 Pain in right forearm: Secondary | ICD-10-CM | POA: Diagnosis not present

## 2014-10-19 DIAGNOSIS — Z79899 Other long term (current) drug therapy: Secondary | ICD-10-CM | POA: Insufficient documentation

## 2014-10-19 DIAGNOSIS — R52 Pain, unspecified: Secondary | ICD-10-CM

## 2014-10-19 MED ORDER — OXYCODONE-ACETAMINOPHEN 5-325 MG PO TABS
2.0000 | ORAL_TABLET | Freq: Once | ORAL | Status: AC
  Administered 2014-10-19: 2 via ORAL
  Filled 2014-10-19: qty 2

## 2014-10-19 MED ORDER — NAPROXEN 500 MG PO TABS: 500.0000 mg | ORAL_TABLET | Freq: Two times a day (BID) | ORAL | Status: AC

## 2014-10-19 NOTE — Discharge Instructions (Signed)
Musculoskeletal Pain Rest. Ice. Elevate. Follow-up with your primary care physician or use the resource guide to find one. Musculoskeletal pain is muscle and boney aches and pains. These pains can occur in any part of the body. Your caregiver may treat you without knowing the cause of the pain. They may treat you if blood or urine tests, X-rays, and other tests were normal.  CAUSES There is often not a definite cause or reason for these pains. These pains may be caused by a type of germ (virus). The discomfort may also come from overuse. Overuse includes working out too hard when your body is not fit. Boney aches also come from weather changes. Bone is sensitive to atmospheric pressure changes. HOME CARE INSTRUCTIONS   Ask when your test results will be ready. Make sure you get your test results.  Only take over-the-counter or prescription medicines for pain, discomfort, or fever as directed by your caregiver. If you were given medications for your condition, do not drive, operate machinery or power tools, or sign legal documents for 24 hours. Do not drink alcohol. Do not take sleeping pills or other medications that may interfere with treatment.  Continue all activities unless the activities cause more pain. When the pain lessens, slowly resume normal activities. Gradually increase the intensity and duration of the activities or exercise.  During periods of severe pain, bed rest may be helpful. Lay or sit in any position that is comfortable.  Putting ice on the injured area.  Put ice in a bag.  Place a towel between your skin and the bag.  Leave the ice on for 15 to 20 minutes, 3 to 4 times a day.  Follow up with your caregiver for continued problems and no reason can be found for the pain. If the pain becomes worse or does not go away, it may be necessary to repeat tests or do additional testing. Your caregiver may need to look further for a possible cause. SEEK IMMEDIATE MEDICAL CARE  IF:  You have pain that is getting worse and is not relieved by medications.  You develop chest pain that is associated with shortness or breath, sweating, feeling sick to your stomach (nauseous), or throw up (vomit).  Your pain becomes localized to the abdomen.  You develop any new symptoms that seem different or that concern you. MAKE SURE YOU:   Understand these instructions.  Will watch your condition.  Will get help right away if you are not doing well or get worse. Document Released: 02/20/2005 Document Revised: 05/15/2011 Document Reviewed: 10/25/2012 [facility] Patient Information 2015 [place], [place]. This information is not intended to replace advice given to you by your health care provider. Make sure you discuss any questions you have with your health care provider.  Emergency Department Resource Guide 1) Find a Doctor and Pay Out of Pocket Although you won't have to find out who is covered by your insurance plan, it is a good idea to ask around and get recommendations. You will then need to call the office and see if the doctor you have chosen will accept you as a new patient and what types of options they offer for patients who are self-pay. Some doctors offer discounts or will set up payment plans for their patients who do not have insurance, but you will need to ask so you aren't surprised when you get to your appointment.  2) Contact Your Local Health Department Not all health departments have doctors that can see patients for  sick visits, but many do, so it is worth a call to see if yours does. If you don't know where your local health department is, you can check in your phone book. The CDC also has a tool to help you locate your state's health department, and many state websites also have listings of all of their local health departments.  3) Find a Walk-in Clinic If your illness is not likely to be very severe or complicated, you may want to try a walk in clinic. These  are popping up all over the country in pharmacies, drugstores, and shopping centers. They're usually staffed by nurse practitioners or physician assistants that have been trained to treat common illnesses and complaints. They're usually fairly quick and inexpensive. However, if you have serious medical issues or chronic medical problems, these are probably not your best option.  No Primary Care Doctor: - Call Health Connect at  [phone] - they can help you locate a primary care doctor that  accepts your insurance, provides certain services, etc. - Physician Referral Service- [phone]  Chronic Pain Problems: Organization         Address  Phone   Notes  [facility]  [phone] Patients need to be referred by their primary care doctor.   Medication Assistance: Organization         Address  Phone   Notes  [facility] [address] [place] [place], [place] [address] [phone] --Must be a resident of [facility] -- Must have NO insurance coverage whatsoever (no Medicaid/ Medicare, etc.) -- The pt. MUST have a primary care doctor that directs their care regularly and follows them in the community   Med[facility]ist  [phone]   [employer]  [phone]    Agencies that provide inexpensive medical care: Organization         Address  Phone   Notes  [facility]  [phone]   [facility]    [phone]   [facility] [address] [place], [place] [address] [phone]   [facility] [address] [place] [place] [phone]   Planned Parenthood    [phone]   [facility]    [phone]   [facility]  [address], [place] Phone:  [phone], Fax:  [phone] Hours of Operation:  9 am - 6 pm, M-F.  Also accepts Medicaid/Medicare and self-pay.  [facility]   [address], [place] Phone: [phone], Fax: [phone]. Hours of Operation:  8:30 am - 5:30 pm, M-F.  Also accepts Medicaid and self-pay.  [facility] [place] [address] [place] Phone: [phone]   [facility] [address] Natasha Bence [place], [place] [phone], Ext. [phone] Mondays & Thursdays: 7-9 AM.  First 15 patients are seen on a first come, first serve basis.    Medicaid-accepting [facility] Providers:  Organization         Address  Phone   Notes  [facility] [address], [place] [phone] Also accepts self-pay patients.  [facility] [address] Laurell Josephs [place], [place]  [phone]   [facility] [address], [place] [phone]   [facility] [address], [place] [phone]  Renaye Rakers [address], [place]   [phone] Only accepts [place] patients after they have their name applied to their card.   Self-Pay (no insurance) in [facility]:  Organization         Address  Phone   Notes  Sickle Cell Patients, [facility] [address] [place], [place] [phone]   [facility] [address] [place], [place] [phone]   [facility] [place]  [address], [place] [phone]   [facility]/Dr. Osei-Bonsu  [address] [place] or [address], [place] [phone] Phone number for both [place] and [place] locations is the same.  [facility] [address] [place] [phone]   [facility] [address], [place] or [address] [phone]   [facility] [address], [place] [phone], phone; [phone], fax Sees patients 1st and 3rd  Saturday of every month.  Must not qualify for public or private insurance (i.e. Medicaid, Medicare, [place] Health Choice, Veterans' Benefits)  Household income should be no more than 200% of the poverty level The clinic cannot treat you if you are pregnant or think you are pregnant  Sexually transmitted diseases are not treated at the clinic.    Dental Care: Organization         Address  Phone  Notes  [facility] [address] [place], [place] [phone] Accepts children up to age 74 who are enrolled in [place] or [place] Health Choice; pregnant women with a Medicaid card; and children who have applied for Medicaid or [place] Health Choice, but were declined, whose parents can pay a reduced fee at time of service.  [facility]  [address], [place] [phone] Accepts children up to age 55 who are enrolled in [place] or [place] Health Choice; pregnant women with a Medicaid card; and children who have applied for Medicaid or [place] Health Choice, but were declined, whose parents can pay a reduced fee at time of service.  [facility]  [address] [place], [place] [phone] Patients are seen by appointment only. Walk-ins are not accepted. [facility] will see patients 18 years of age and older. Monday - Tuesday (8am-5pm) Most Wednesdays (8:30-5pm) $30 per visit, cash only  [facility] Adult Dental Access PRO[facility]  [address], [facility] [phone] Patients are seen by appointment only. Walk-ins are not accepted. [facility] will see patients 12 years of age and older. One Wednesday Evening (Monthly: Volunteer Based).  $30 per visit, cash only  [employer]  [phone] for adults; Children under age 7, call Graduate Pediatric Dentistry at [phone]. Children aged 74-14, please call [phone] to request a pediatric  application.  Dental services are provided in all areas of dental care including fillings, crowns and bridges, complete and partial dentures, implants, gum treatment, root canals, and extractions. Preventive care is also provided. Treatment is provided to both adults and children. Patients are selected via a lottery and there is often a waiting list.   [facility] [address] [place]  [phone] [email or web address]   [facility] [address] [place], [place] [phone], Ext.  123 Second and Fourth Thursday of each month, opens at 6:30 AM; Clinic ends at 9 AM.  Patients are seen on a first-come first-served basis, and a limited number are seen during each clinic.   [facility]  [address] [place], [place] [phone]   Eligibility Requirements You must have lived in [place], [place], or [place] counties for at least the last three months.   You cannot be eligible for state or federal sponsored [place], including [employer], [place], or [employer].   You generally cannot be eligible for healthcare insurance through your employer.    How to apply: Eligibility screenings are held every Tuesday and Wednesday afternoon from 1:00 pm until 4:00 pm. You do not need an appointment for the interview!  [facility] [address], [place], [place] [phone]   [facility] Health Department  [phone]   [facility] Health Department  [phone]   [facility] Health Department  [phone]    Behavioral Health Resources in the Community: Intensive Outpatient Programs Organization         Address  Phone  Notes  [facility] [address] [place], [place] [phone]   [facility] [address], [place], [place] [phone]   [facility] [address] [place], [place]  [phone]   [facility]  [address],  [place], [place] [phone] or [phone]   Substance Abuse Resources Organization         Address  Phone  Notes  [facility]  [phone]   [facility]  [phone]   [facility]  [phone]   Floydene Flock  [phone]   Residential & Outpatient Substance Abuse Program  [phone]   Psychological Services Organization         Address  Phone  Notes  [facility] Behavioral Health  [phone]   [facility]  [phone]   [facility] Mental Health [address] [place] [phone] or [phone]    Mobile Crisis Teams Organization         Address  Phone  Notes  [facility], Mobile Crisis Care Unit  [phone]   [facility]  [address] [place], [place] [phone]   Doristine Locks [address] [place] [phone]    Self-Help/Support Groups Organization         Address  Phone             Notes  [facility] [place] - variety of support groups  [phone]- [phone] Call for more information  Narcotics Anonymous (NA), [facility] [address], [employer]   2 meetings at this location   Statistician         Address  Phone  Notes  [facility] [address],    [place]  [phone]   [facility]  [address], [place] [address], [place], [place] [phone]   [facility] [address] [place], [place] [phone] Admissions: 8am-3pm M-F  [facility] [address],    [place], [place] [phone]   [facility] [address] Starling Manns [place], [place] [phone]   [facility] [address]  [place], [place] [phone]   [facility] - Intensive Outpatient [address]ure[address], [place], [place] [phone]   [facility]  [address]   [place], [place] [phone] or [phone]   [facility] ([facility]) [address] [place], [place] [phone] Accepts Medicaid  [facility] [place] [address],  [place] [phone] Substance Abuse/Addiction Treatment   [facility] Organization         Address  Phone  Notes  [facility]  [phone]   Angie Fava, PhD [address] Ervin Knack [place], [place]   [phone] or [phone]   [facility] Behavioral   [address] [place], [place] [phone]   [facility] [address], [place], [place] [phone] Insurance/Medicaid/sponsorship through [facility] [address]                                    [place], [place] [phone] Therapy/tele-psych/case  [facility] [address], [place] [phone]    Dr. Lolly Mustache  [phone]   [facility]  [employer] [facility] Dept. 1) [address], [place] 2) [address] [place] 3)  [address], [place] [phone]  [phone]   [facility] Child Abuse Hotline [phone] or [phone] (After Hours)

## 2014-10-19 NOTE — ED Provider Notes (Signed)
CSN: [id number]     Arrival date & time 10/19/14  1209 History   First MD Initiated Contact with Patient 10/19/14 1233     Chief Complaint  Patient presents with  . Arm Pain     (Consider location/radiation/quality/duration/timing/severity/associated sxs/prior Treatment) Patient is a 39 y.o. male presenting with arm pain. The history is provided by the patient. No language interpreter was used.  Arm Pain Associated symptoms include myalgias. Pertinent negatives include no fever.  Kyle Franklin is a 39 year old male with a history of kidney stones and back pain who presents for right arm pain that began 3 weeks ago and has since worsened. 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 He reports right elbow and forearm pain with extension and rotation. He took ibuprofen with minimal relief. He denies any fever, chills, numbness or tingling. He denies any injury or fall.   Past Medical History  Diagnosis Date  . Kidney stones   . Kidney stone   . Back pain    Past Surgical History  Procedure Laterality Date  . Appendectomy    . Shoulder surgery    . Hernia repair     No family history on file. Social History  Substance Use Topics  . Smoking status: Current Every Day Smoker -- 1.00 packs/day    Types: Cigarettes  . Smokeless tobacco: Never Used  . Alcohol Use: No    Review of Systems  Constitutional: Negative for fever.  Musculoskeletal: Positive for myalgias.  Skin: Negative for color change and wound.      Allergies  Vicodin; Ciprofloxacin; Tape; and Tramadol hcl  Home Medications   Prior to Admission medications   Medication Sig Start Date End Date Taking? Authorizing Provider  DULoxetine HCl (CYMBALTA PO) Take by mouth.   Yes Historical Provider, MD  baclofen (LIORESAL) 10 MG tablet Take 10 mg by mouth Nightly.    Historical Provider, MD  cyclobenzaprine (FLEXERIL) 10 MG tablet Take 1 tablet (10 mg total) by mouth 3  (three) times daily as needed for muscle spasms. 06/18/14   Pricilla Loveless, MD  escitalopram (LEXAPRO) 20 MG tablet Take 20 mg by mouth daily.    Historical Provider, MD  ibuprofen (ADVIL,MOTRIN) 800 MG tablet Take 1 tablet (800 mg total) by mouth every 8 (eight) hours as needed for mild pain. 05/19/14   Kristen N Ward, DO  naproxen (NAPROSYN) 500 MG tablet Take 1 tablet (500 mg total) by mouth 2 (two) times daily. 10/19/14   Levetta Bognar Patel-Mills, PA-C  ondansetron (ZOFRAN ODT) 4 MG disintegrating tablet Take 1 tablet (4 mg total) by mouth every 8 (eight) hours as needed for nausea or vomiting. 05/19/14   Kristen N Ward, DO  oxyCODONE-acetaminophen (PERCOCET/ROXICET) 5-325 MG per tablet Take 1 tablet by mouth every 8 (eight) hours as needed for severe pain. 06/23/14   [name] Sciacca, PA-C  simvastatin (ZOCOR) 20 MG tablet Take 20 mg by mouth daily.    Historical Provider, MD   BP 114/74 mmHg  Pulse 88  Temp(Src) 98.3 F (36.8 C) (Oral)  Resp 18  Ht 5\' 7"  (1.702 m)  Wt 165 lb (74.844 kg)  BMI 25.84 kg/m2  SpO2 100% Physical Exam  Constitutional: He is oriented to person, place, and time. He appears well-developed and well-nourished.  HENT:  Head: Normocephalic and atraumatic.  Eyes: Conjunctivae are normal.  Neck: Neck supple.  Cardiovascular: Normal rate.   Pulmonary/Chest: Effort normal.  Musculoskeletal:  Right  arm: Tenderness to palpation of the proximal forearm. No olecranon tenderness. Limited extension of the right arm at the elbow. Able to flex and extend all fingers. 2+ radial pulse. NVI. No tenderness to palpation of the wrist or fingers. No edema or ecchymosis to the right forearm.  Neurological: He is alert and oriented to person, place, and time.  Skin: Skin is warm and dry.  Psychiatric: He has a normal mood and affect. His behavior is normal.  Nursing note and vitals reviewed.   ED Course  Procedures (including critical care time) Labs Review Labs Reviewed - No data to  display  Imaging Review Dg Forearm Right  10/19/2014   CLINICAL DATA:  Pain and stiffness for 3 weeks  EXAM: RIGHT FOREARM - 2 VIEW  COMPARISON:  November 03, 2006  FINDINGS: Frontal and lateral views were obtained. No fracture or dislocation. Joint spaces appear intact. No erosive change or intra-articular calcification.  IMPRESSION: No abnormality noted.   Electronically Signed   By: Bretta Bang III M.D.   On: 10/19/2014 13:11   I, Kyle Franklin, Kyle Franklin, personally reviewed and evaluated these images and lab results as part of my medical decision-making.   EKG Interpretation None      MDM   Final diagnoses:  Pain  Right forearm pain   Patient is well-appearing and in no acute distress. Right forearm x-ray shows no fracture or dislocation. No sail sign or soft tissue swelling. I discussed x-ray findings with the patient. I also discussed taking anti-inflammatory's. He was given a prescription for naproxen. I also discussed follow-up with his primary care physician and [facility]. Patient verbally agrees with the plan.  Medications  oxyCODONE-acetaminophen (PERCOCET/ROXICET) 5-325 MG per tablet 2 tablet (2 tablets Oral Given 10/19/14 1304)     Catha Gosselin, PA-C 10/19/14 1405  Doug Sou, MD 10/19/14 1459

## 2014-10-19 NOTE — ED Notes (Signed)
Right arm pain x 3 weeks. 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

## 2015-07-02 ENCOUNTER — Emergency Department (HOSPITAL_BASED_OUTPATIENT_CLINIC_OR_DEPARTMENT_OTHER)
Admission: EM | Admit: 2015-07-02 | Discharge: 2015-07-02 | Disposition: A | Discharge status: Home / Self Care | Payer: Medicaid Other | Attending: Emergency Medicine | Admitting: Emergency Medicine

## 2015-07-02 ENCOUNTER — Encounter (HOSPITAL_BASED_OUTPATIENT_CLINIC_OR_DEPARTMENT_OTHER): Payer: Self-pay | Admitting: Emergency Medicine

## 2015-07-02 ENCOUNTER — Emergency Department (HOSPITAL_BASED_OUTPATIENT_CLINIC_OR_DEPARTMENT_OTHER): Payer: Medicaid Other

## 2015-07-02 DIAGNOSIS — Y939 Activity, unspecified: Secondary | ICD-10-CM | POA: Diagnosis not present

## 2015-07-02 DIAGNOSIS — Y999 Unspecified external cause status: Secondary | ICD-10-CM | POA: Diagnosis not present

## 2015-07-02 DIAGNOSIS — M791 Myalgia: Secondary | ICD-10-CM | POA: Diagnosis not present

## 2015-07-02 DIAGNOSIS — R300 Dysuria: Secondary | ICD-10-CM | POA: Insufficient documentation

## 2015-07-02 DIAGNOSIS — F17210 Nicotine dependence, cigarettes, uncomplicated: Secondary | ICD-10-CM | POA: Diagnosis not present

## 2015-07-02 DIAGNOSIS — Y929 Unspecified place or not applicable: Secondary | ICD-10-CM | POA: Diagnosis not present

## 2015-07-02 DIAGNOSIS — R110 Nausea: Secondary | ICD-10-CM | POA: Diagnosis not present

## 2015-07-02 DIAGNOSIS — S39012A Strain of muscle, fascia and tendon of lower back, initial encounter: Secondary | ICD-10-CM | POA: Diagnosis not present

## 2015-07-02 DIAGNOSIS — F329 Major depressive disorder, single episode, unspecified: Secondary | ICD-10-CM | POA: Diagnosis not present

## 2015-07-02 DIAGNOSIS — R319 Hematuria, unspecified: Secondary | ICD-10-CM | POA: Diagnosis not present

## 2015-07-02 DIAGNOSIS — X58XXXA Exposure to other specified factors, initial encounter: Secondary | ICD-10-CM | POA: Diagnosis not present

## 2015-07-02 DIAGNOSIS — S3991XA Unspecified injury of abdomen, initial encounter: Secondary | ICD-10-CM | POA: Diagnosis present

## 2015-07-02 HISTORY — DX: Depression, unspecified: F32.A

## 2015-07-02 HISTORY — DX: Gastro-esophageal reflux disease without esophagitis: K21.9

## 2015-07-02 HISTORY — DX: Major depressive disorder, single episode, unspecified: F32.9

## 2015-07-02 LAB — BASIC METABOLIC PANEL
Anion gap: 4 — ABNORMAL LOW (ref 5–15)
BUN: 9 mg/dL (ref 6–20)
CO2: 25 mmol/L (ref 22–32)
Calcium: 9.1 mg/dL (ref 8.9–10.3)
Chloride: 106 mmol/L (ref 101–111)
Creatinine, Ser: 0.95 mg/dL (ref 0.61–1.24)
GFR calc Af Amer: >60 mL/min (ref >60)
GFR calc non Af Amer: >60 mL/min (ref >60)
GLUCOSE: 100 mg/dL — AB (ref 65–99)
POTASSIUM: 3.4 mmol/L — AB (ref 3.5–5.1)
Sodium: 135 mmol/L (ref 135–145)

## 2015-07-02 LAB — CBC WITH DIFFERENTIAL/PLATELET
Basophils Absolute: 0.1 10*3/uL (ref 0.0–0.1)
Basophils Relative: 1 %
EOS PCT: 2 %
Eosinophils Absolute: 0.2 10*3/uL (ref 0.0–0.7)
HCT: 40.9 % (ref 39.0–52.0)
HEMOGLOBIN: 14.5 g/dL (ref 13.0–17.0)
LYMPHS PCT: 16 %
Lymphs Abs: 1.3 10*3/uL (ref 0.7–4.0)
MCH: 31 pg (ref 26.0–34.0)
MCHC: 35.5 g/dL (ref 30.0–36.0)
MCV: 87.6 fL (ref 78.0–100.0)
Monocytes Absolute: 0.4 10*3/uL (ref 0.1–1.0)
Monocytes Relative: 5 %
NEUTROS PCT: 76 %
Neutro Abs: 5.9 10*3/uL (ref 1.7–7.7)
Platelets: 151 10*3/uL (ref 150–400)
RBC: 4.67 MIL/uL (ref 4.22–5.81)
RDW: 13.1 % (ref 11.5–15.5)
WBC: 7.8 10*3/uL (ref 4.0–10.5)

## 2015-07-02 LAB — URINALYSIS, ROUTINE W REFLEX MICROSCOPIC
Bilirubin Urine: NEGATIVE
Glucose, UA: NEGATIVE mg/dL
HGB URINE DIPSTICK: NEGATIVE
Ketones, ur: NEGATIVE mg/dL
Leukocytes, UA: NEGATIVE
Nitrite: NEGATIVE
Protein, ur: NEGATIVE mg/dL
SPECIFIC GRAVITY, URINE: 1.008 (ref 1.005–1.030)
pH: 7 (ref 5.0–8.0)

## 2015-07-02 MED ORDER — KETOROLAC TROMETHAMINE 60 MG/2ML IM SOLN
60.0000 mg | Freq: Once | INTRAMUSCULAR | Status: AC
  Administered 2015-07-02: 60 mg via INTRAMUSCULAR
  Filled 2015-07-02: qty 2

## 2015-07-02 MED ORDER — FENTANYL CITRATE (PF) 100 MCG/2ML IJ SOLN
50.0000 ug | INTRAMUSCULAR | Status: DC | PRN
  Administered 2015-07-02: 50 ug via INTRAVENOUS
  Filled 2015-07-02: qty 2

## 2015-07-02 MED ORDER — KETOROLAC TROMETHAMINE 30 MG/ML IJ SOLN: 30.0000 mg | Freq: Once | INTRAMUSCULAR | Status: DC

## 2015-07-02 MED ORDER — ONDANSETRON HCL 4 MG/2ML IJ SOLN
4.0000 mg | Freq: Once | INTRAMUSCULAR | Status: AC
  Administered 2015-07-02: 4 mg via INTRAVENOUS
  Filled 2015-07-02: qty 2

## 2015-07-02 MED ORDER — METHOCARBAMOL 500 MG PO TABS
1000.0000 mg | ORAL_TABLET | Freq: Three times a day (TID) | ORAL | Status: AC | PRN
Start: 2015-07-02 — End: ?

## 2015-07-02 MED ORDER — IBUPROFEN 600 MG PO TABS: 600.0000 mg | ORAL_TABLET | Freq: Three times a day (TID) | ORAL | Status: DC | PRN

## 2015-07-02 NOTE — ED Notes (Addendum)
Patient reports right flank pain which began on Wednesday.  Reports hematuria, dysuria.  Reports nausea and vomiting.

## 2015-07-02 NOTE — Discharge Instructions (Signed)

## 2015-07-02 NOTE — ED Provider Notes (Signed)
CSN: [id number]     Arrival date & time 07/02/15  1214 History   First MD Initiated Contact with Patient 07/02/15 1502     Chief Complaint  Patient presents with  . Flank Pain     (Consider location/radiation/quality/duration/timing/severity/associated sxs/prior Treatment) HPI Patient presents with right flank pain that began on Wednesday. States it radiates to the right lower quadrant. Associated with dysuria and hematuria. He's also had some nausea. No fever or chills. He states he's had stones in the past and this is very similar. Denies any pain radiating down the leg. No weakness or numbness. Previous appendectomy. Testicular pain or swelling. Past Medical History  Diagnosis Date  . Kidney stones   . Kidney stone   . Back pain   . GERD (gastroesophageal reflux disease)   . Depression    Past Surgical History  Procedure Laterality Date  . Appendectomy    . Shoulder surgery    . Hernia repair     History reviewed. No pertinent family history. Social History  Substance Use Topics  . Smoking status: Current Every Day Smoker -- 1.00 packs/day    Types: Cigarettes  . Smokeless tobacco: Never Used  . Alcohol Use: No    Review of Systems  Constitutional: Negative for fever and chills.  Respiratory: Negative for cough and shortness of breath.   Cardiovascular: Negative for chest pain, palpitations and leg swelling.  Gastrointestinal: Positive for nausea and abdominal pain. Negative for vomiting, diarrhea and constipation.  Genitourinary: Positive for dysuria, hematuria and flank pain. Negative for frequency, scrotal swelling, difficulty urinating and testicular pain.  Musculoskeletal: Positive for myalgias and back pain. Negative for neck pain and neck stiffness.  Skin: Negative for rash and wound.  Neurological: Negative for dizziness, weakness, light-headedness, numbness and headaches.  All other systems reviewed and are negative.     Allergies  Vicodin; Ciprofloxacin;  Tape; and Tramadol hcl  Home Medications   Prior to Admission medications   Medication Sig Start Date End Date Taking? Authorizing Provider  omeprazole (PRILOSEC) 40 MG capsule Take 40 mg by mouth daily.   Yes Historical Provider, MD  baclofen (LIORESAL) 10 MG tablet Take 10 mg by mouth Nightly.    Historical Provider, MD  cyclobenzaprine (FLEXERIL) 10 MG tablet Take 1 tablet (10 mg total) by mouth 3 (three) times daily as needed for muscle spasms. 06/18/14   Pricilla Loveless, MD  DULoxetine HCl (CYMBALTA PO) Take by mouth.    Historical Provider, MD  escitalopram (LEXAPRO) 20 MG tablet Take 20 mg by mouth daily.    Historical Provider, MD  ibuprofen (ADVIL,MOTRIN) 600 MG tablet Take 1 tablet (600 mg total) by mouth every 8 (eight) hours as needed for mild pain or moderate pain. 07/02/15   Loren Racer, MD  methocarbamol (ROBAXIN) 500 MG tablet Take 2 tablets (1,000 mg total) by mouth every 8 (eight) hours as needed for muscle spasms. 07/02/15   Loren Racer, MD  naproxen (NAPROSYN) 500 MG tablet Take 1 tablet (500 mg total) by mouth 2 (two) times daily. 10/19/14   Hanna Patel-Mills, PA-C  ondansetron (ZOFRAN ODT) 4 MG disintegrating tablet Take 1 tablet (4 mg total) by mouth every 8 (eight) hours as needed for nausea or vomiting. 05/19/14   Kristen N Ward, DO  oxyCODONE-acetaminophen (PERCOCET/ROXICET) 5-325 MG per tablet Take 1 tablet by mouth every 8 (eight) hours as needed for severe pain. 06/23/14   [name] Sciacca, PA-C  simvastatin (ZOCOR) 20 MG tablet Take 20 mg by mouth  daily.    Historical Provider, MD   BP 112/86 mmHg  Pulse 100  Temp(Src) 98.5 F (36.9 C) (Oral)  Resp 18  Ht 5\' 7"  (1.702 m)  Wt 170 lb (77.111 kg)  BMI 26.62 kg/m2  SpO2 100% Physical Exam  Constitutional: He is oriented to person, place, and time. He appears well-developed and well-nourished. No distress.  Patient appears to be in no distress.  HENT:  Head: Normocephalic and atraumatic.  Mouth/Throat:  Oropharynx is clear and moist.  Eyes: EOM are normal. Pupils are equal, round, and reactive to light.  Neck: Normal range of motion. Neck supple.  Cardiovascular: Normal rate and regular rhythm.   Pulmonary/Chest: Effort normal and breath sounds normal. No respiratory distress. He has no wheezes. He has no rales.  Abdominal: Soft. Bowel sounds are normal. He exhibits no distension and no mass. There is tenderness (very mild right lower quadrant tenderness to deep palpation. There is no rebound or guarding.). There is no rebound and no guarding.  Musculoskeletal: Normal range of motion. He exhibits tenderness. He exhibits no edema.  Right CVA tenderness and right paraspinal lumbar tenderness. There is no midline thoracic or lumbar tenderness. Negative straight leg raise bilaterally. Distal pulses are equal and intact. No lower extremity swelling or asymmetry.  Neurological: He is alert and oriented to person, place, and time.  5/5 motor in all extremities. Sensation is fully intact.  Skin: Skin is warm and dry. No rash noted. No erythema.  Psychiatric: He has a normal mood and affect. His behavior is normal.  Nursing note and vitals reviewed.   ED Course  Procedures (including critical care time) Labs Review Labs Reviewed  BASIC METABOLIC PANEL - Abnormal; Notable for the following:    Potassium 3.4 (*)    Glucose, Bld 100 (*)    Anion gap 4 (*)    All other components within normal limits  URINALYSIS, ROUTINE W REFLEX MICROSCOPIC ([facility])  CBC WITH DIFFERENTIAL/PLATELET    Imaging Review [place]  07/02/2015  CLINICAL DATA:  Right flank pain radiating to the lower right back for 2 days EXAM: RENAL / URINARY TRACT ULTRASOUND COMPLETE COMPARISON:  None. FINDINGS: Right Kidney: Length: 12.1 mm. Echogenicity within normal limits. No mass or hydronephrosis visualized. Left Kidney: Length: 11.5 cm. Echogenicity within normal limits. No mass or hydronephrosis visualized. Bladder: Appears  normal for degree of bladder distention. Other: Increased hepatic parenchymal echogenicity. IMPRESSION: 1. Normal renal ultrasound. 2. Hepatic steatosis. Electronically Signed   By: Elige KoHetal  Patel   On: 07/02/2015 16:27   I have personally reviewed and evaluated these images and lab results as part of my medical decision-making.   EKG Interpretation None      MDM   Final diagnoses:  Lumbar strain, initial encounter    Patient is very well-appearing and in no distress. Urine without signs of hematuria.  No evidence of urinary tract obstruction on ultrasound. Patient continues to be very well-appearing. No vomiting in ER. Think symptoms are more likely muscular in nature. We'll treat with NSAIDs and muscle relaxants. Advised to use heat home. Return precautions given.  Loren Raceravid Wyona Neils, MD 07/02/15 1723

## 2016-09-07 ENCOUNTER — Encounter (HOSPITAL_BASED_OUTPATIENT_CLINIC_OR_DEPARTMENT_OTHER): Payer: Self-pay | Admitting: Emergency Medicine

## 2016-09-07 ENCOUNTER — Emergency Department (HOSPITAL_BASED_OUTPATIENT_CLINIC_OR_DEPARTMENT_OTHER)
Admission: EM | Admit: 2016-09-07 | Discharge: 2016-09-07 | Disposition: A | Discharge status: Home / Self Care | Payer: Self-pay | Attending: Emergency Medicine | Admitting: Emergency Medicine

## 2016-09-07 ENCOUNTER — Emergency Department (HOSPITAL_BASED_OUTPATIENT_CLINIC_OR_DEPARTMENT_OTHER): Payer: Self-pay

## 2016-09-07 DIAGNOSIS — Z79899 Other long term (current) drug therapy: Secondary | ICD-10-CM | POA: Insufficient documentation

## 2016-09-07 DIAGNOSIS — M7582 Other shoulder lesions, left shoulder: Secondary | ICD-10-CM

## 2016-09-07 DIAGNOSIS — M25512 Pain in left shoulder: Secondary | ICD-10-CM

## 2016-09-07 DIAGNOSIS — M65811 Other synovitis and tenosynovitis, right shoulder: Secondary | ICD-10-CM | POA: Insufficient documentation

## 2016-09-07 DIAGNOSIS — F17210 Nicotine dependence, cigarettes, uncomplicated: Secondary | ICD-10-CM | POA: Insufficient documentation

## 2016-09-07 MED ORDER — ACETAMINOPHEN 325 MG PO TABS: 650.0000 mg | ORAL_TABLET | Freq: Four times a day (QID) | ORAL | 0 refills | Status: AC | PRN

## 2016-09-07 MED ORDER — IBUPROFEN 600 MG PO TABS: 600.0000 mg | ORAL_TABLET | Freq: Four times a day (QID) | ORAL | 0 refills | Status: AC | PRN

## 2016-09-07 MED ORDER — IBUPROFEN 400 MG PO TABS
600.0000 mg | ORAL_TABLET | Freq: Once | ORAL | Status: AC
  Administered 2016-09-07: 600 mg via ORAL
  Filled 2016-09-07: qty 1

## 2016-09-07 NOTE — ED Provider Notes (Signed)
[facility] Note   CSN: [id number] Arrival date & time: 09/07/16  1806  By signing my name below, I, Kyle Franklin, attest that this documentation has been prepared under the direction and in the presence of Derwood Kaplan, MD. Electronically Signed: Cynda Franklin, Scribe. 09/07/16. 6:42 PM.  History   Chief Complaint Chief Complaint  Patient presents with  . Shoulder Pain   HPI Comments: Kyle Franklin is a 41 y.o. male with a history of rotator cuff surgery, who presents to the Emergency Department complaining of sudden-onset, constant left shoulder pain that began two days ago. Patient states he fell 2 days ago, he has had pain and left shoulder "popping" ever since. Patient states his pain is similar to his prior rotator cuff injury requiring surgery. Patient reports an associated  throbbing sensation that radiates down the let arm, left finger tingling, and swelling. No medication or alleviating factors indicated. Patient states his pain is worse with movement and raising his arm, nothing improves his pain. Patient denies any tingling, weakness, fever, chills, or any additional injuries.   The history is provided by the patient. No language interpreter was used.    Past Medical History:  Diagnosis Date  . Back pain   . Depression   . GERD (gastroesophageal reflux disease)   . Kidney stone   . Kidney stones     Patient Active Problem List   Diagnosis Date Noted  . Right flank pain 01/04/2013    Past Surgical History:  Procedure Laterality Date  . APPENDECTOMY    . HERNIA REPAIR    . SHOULDER SURGERY         Home Medications    Prior to Admission medications   Medication Sig Start Date End Date Taking? Authorizing Provider  acetaminophen (TYLENOL) 325 MG tablet Take 2 tablets (650 mg total) by mouth every 6 (six) hours as needed. 09/07/16   Derwood Kaplan, MD  baclofen (LIORESAL) 10 MG tablet Take 10 mg by mouth Nightly.    [provider]  cyclobenzaprine (FLEXERIL) 10 MG tablet Take 1 tablet (10 mg total) by mouth 3 (three) times daily as needed for muscle spasms. 06/18/14   Pricilla Loveless, MD  DULoxetine HCl (CYMBALTA PO) Take by mouth.    [provider]  escitalopram (LEXAPRO) 20 MG tablet Take 20 mg by mouth daily.    [provider]  ibuprofen (ADVIL,MOTRIN) 600 MG tablet Take 1 tablet (600 mg total) by mouth every 6 (six) hours as needed for mild pain or moderate pain. 09/07/16   Derwood Kaplan, MD  methocarbamol (ROBAXIN) 500 MG tablet Take 2 tablets (1,000 mg total) by mouth every 8 (eight) hours as needed for muscle spasms. 07/02/15   Loren Racer, MD  naproxen (NAPROSYN) 500 MG tablet Take 1 tablet (500 mg total) by mouth 2 (two) times daily. 10/19/14   Patel-Mills, Lorelle Formosa, PA-C  omeprazole (PRILOSEC) 40 MG capsule Take 40 mg by mouth daily.    [provider]  ondansetron (ZOFRAN ODT) 4 MG disintegrating tablet Take 1 tablet (4 mg total) by mouth every 8 (eight) hours as needed for nausea or vomiting. 05/19/14   Ward, Layla Maw, DO  oxyCODONE-acetaminophen (PERCOCET/ROXICET) 5-325 MG per tablet Take 1 tablet by mouth every 8 (eight) hours as needed for severe pain. 06/23/14   Sciacca, [name], PA-C  simvastatin (ZOCOR) 20 MG tablet Take 20 mg by mouth daily.    [provider]    Family History History reviewed. No  pertinent family history.  Social History Social History  Substance Use Topics  . Smoking status: Current Every Day Smoker    Packs/day: 1.00    Types: Cigarettes  . Smokeless tobacco: Never Used  . Alcohol use No     Allergies   Vicodin [hydrocodone-acetaminophen]; Ciprofloxacin; Tape; and Tramadol hcl   Review of Systems Review of Systems  Constitutional: Negative for chills and fever.  Gastrointestinal: Negative for nausea and vomiting.  Musculoskeletal: Positive for arthralgias (left shoulder) and joint swelling (left shoulder).    Neurological: Positive for numbness (left fingers). Negative for weakness.     Physical Exam Updated Vital Signs BP (!) 139/91   Pulse 74   Temp 98 F (36.7 C) (Oral)   Resp 18   Wt 74.8 kg (165 lb)   SpO2 100%   BMI 25.84 kg/m   Physical Exam  Constitutional: He is oriented to person, place, and time. He appears well-developed and well-nourished.  HENT:  Head: Normocephalic.  Eyes: EOM are normal.  Neck: Normal range of motion.  Cardiovascular: Normal rate and regular rhythm.   Pulmonary/Chest: Effort normal and breath sounds normal.  Musculoskeletal: Normal range of motion. He exhibits tenderness. He exhibits no edema or deformity.  Left shoulder anteriorly has inflammation. Patient has tenderness with abduction starting at the level of shoulder pain. Tenderness with forward flexion and anterior rotation of left shoulder appreciated. 2 + radial pulse distally.   Neurological: He is alert and oriented to person, place, and time.  Psychiatric: He has a normal mood and affect.  Nursing note and vitals reviewed.    ED Treatments / Results  DIAGNOSTIC STUDIES: Oxygen Saturation is 100% on RA, normal by my interpretation.    COORDINATION OF CARE: 6:39 PM Discussed treatment plan with pt at bedside and pt agreed to plan, which includes ibuprofen and imaging.   Labs (all labs ordered are listed, but only abnormal results are displayed) Labs Reviewed - No data to display  EKG  EKG Interpretation None       Radiology Dg Shoulder Left  Result Date: 09/07/2016 CLINICAL DATA:  Left shoulder pain beginning 2 days ago after fall. EXAM: LEFT SHOULDER - 2+ VIEW COMPARISON:  None. FINDINGS: There is no evidence of fracture or dislocation. There is no evidence of arthropathy or other focal bone abnormality. Soft tissues are unremarkable. IMPRESSION: Negative. Electronically Signed   By: Gerome Sam III M.D   On: 09/07/2016 18:57    Procedures Procedures (including  critical care time)  Medications Ordered in ED Medications  ibuprofen (ADVIL,MOTRIN) tablet 600 mg (600 mg Oral Given 09/07/16 1851)     Initial Impression / Assessment and Plan / ED Course  I have reviewed the triage vital signs and the nursing notes.  Pertinent labs & imaging results that were available during my care of the patient were reviewed by me and considered in my medical decision making (see chart for details).     Pt comes in with cc of shoulder pain. Pt had a fall few days back and he has had severe tenderness since then to the shoulder, especially with ROM. Pt's exam reveals tenderness with abduction and with internal rotation. Shoulder capsule could be affected due to blunt trauma. Also possible is rotator cuff tendinitis. We will have him use RICE tx and f.u with sports med if not better in 2 weeks.   Final Clinical Impressions(s) / ED Diagnoses   Final diagnoses:  Acute pain of left shoulder  Rotator  cuff tendinitis, left    New Prescriptions New Prescriptions   ACETAMINOPHEN (TYLENOL) 325 MG TABLET    Take 2 tablets (650 mg total) by mouth every 6 (six) hours as needed.  I personally performed the services described in this documentation, which was scribed in my presence. The recorded information has been reviewed and is accurate.     Derwood KaplanNanavati, Emrick Hensch, MD 09/07/16 [phone]

## 2016-09-07 NOTE — ED Triage Notes (Signed)
Left side

## 2016-09-07 NOTE — Discharge Instructions (Signed)
See the orthopedics doctor in 2 weeks if not better. Until then, use ice every 4 hours and take motrin and tylenol for pain. Continue with simple range of motion exercises.

## 2016-09-07 NOTE — ED Notes (Signed)
ED Provider at bedside. 

## 2016-10-19 IMAGING — CT CT HEAD W/O CM
4 of 5 series · 15 of 47 positions shown, 16 images · non-contrast
Comparison: None.

CLINICAL DATA: Patient fell twice yesterday, hit his head on 1 of
the falls with possible loss of consciousness

EXAM:
CT HEAD WITHOUT CONTRAST
CT CERVICAL SPINE WITHOUT CONTRAST
TECHNIQUE: Multidetector CT imaging of the head and cervical spine was
performed following the standard protocol without intravenous
contrast. Multiplanar CT image reconstructions of the cervical spine
were also generated.

[Series 2: head 4.8 h37s · axial · 0.45mm/px · z∈[-140,-92]mm · 2 of 32 slices shown, 3 images]
[im 11/32  brain]
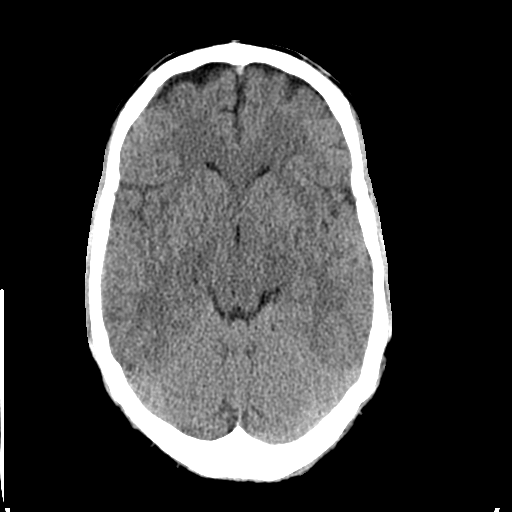
[im 11/32  bone]
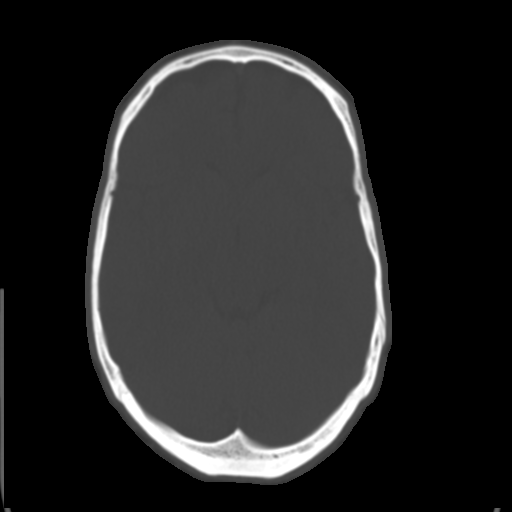
[im 21/32  brain]
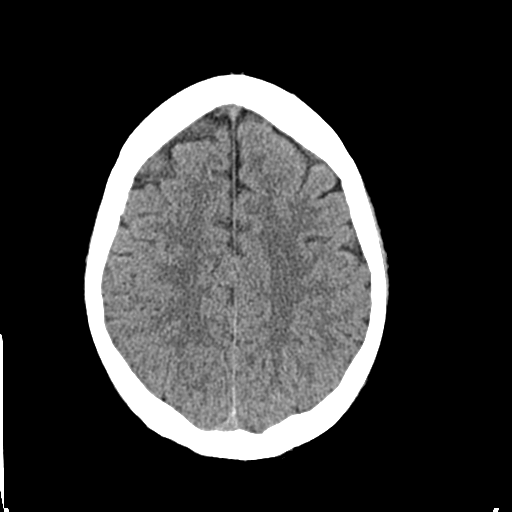

[Series 5: c_spine 2.0 b41s st · axial · 0.28mm/px · z∈[-344,-210]mm · 7 of 102 slices shown]
[im 9/102  brain]
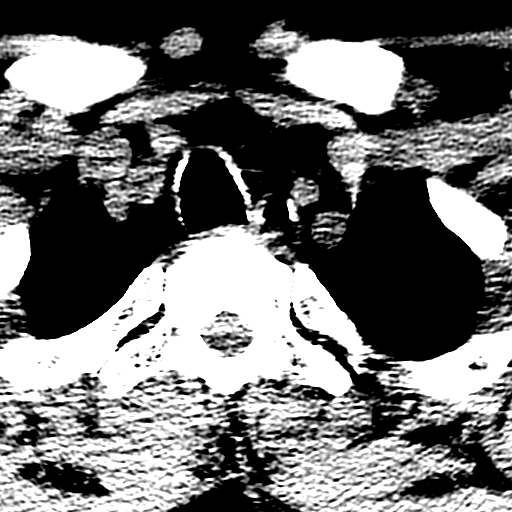
[im 26/102  brain]
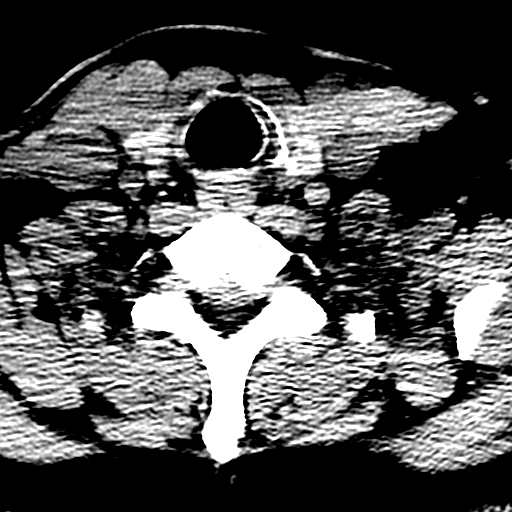
[im 34/102  brain]
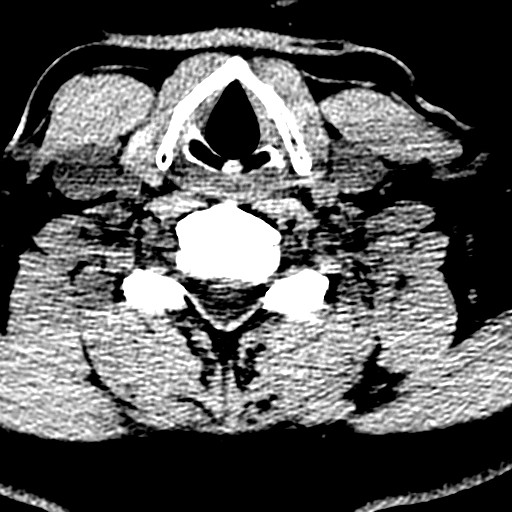
[im 43/102  brain]
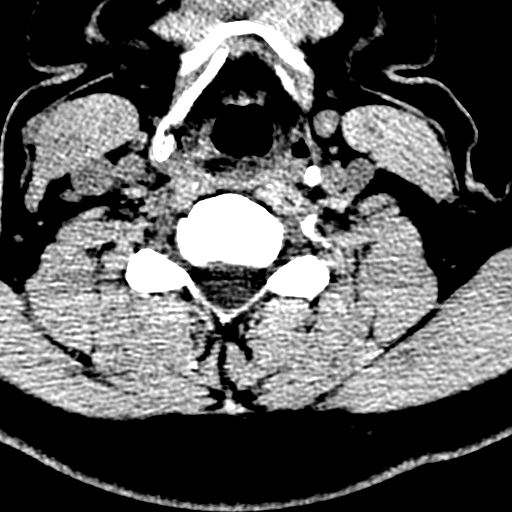
[im 59/102  brain]
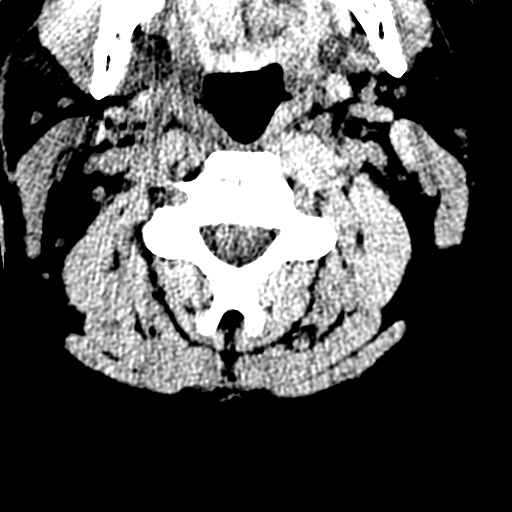
[im 68/102  brain]
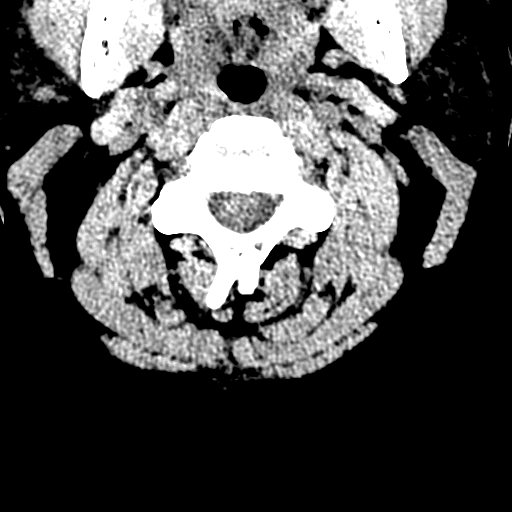
[im 76/102  brain]
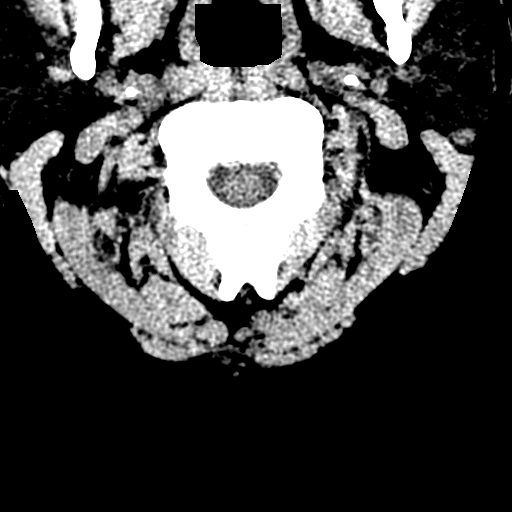

[Series 8: c_spine 2.0 coronal · coronal · 0.40mm/px · 3 of 75 slices shown]
[im 25/75  brain]
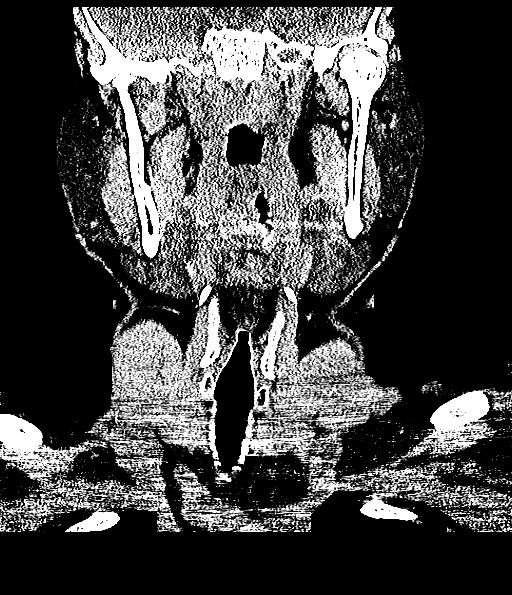
[im 33/75  brain]
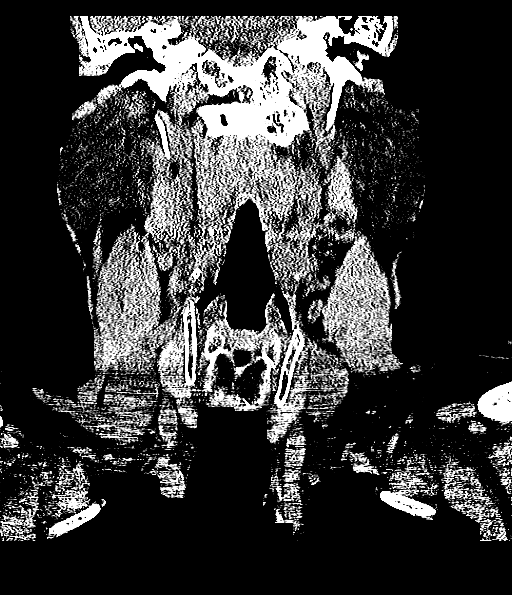
[im 42/75  brain]
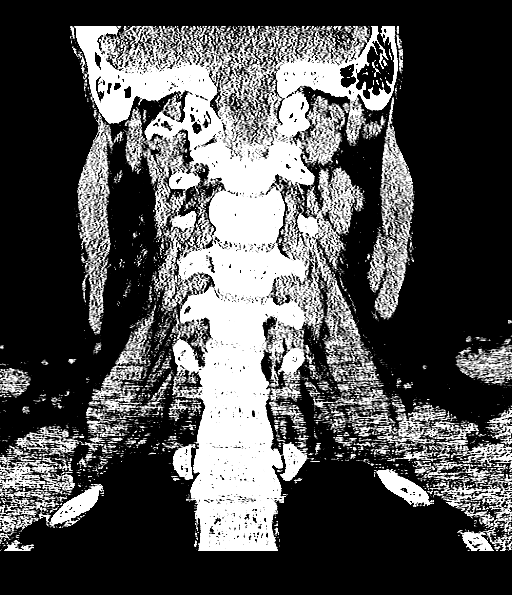

[Series 9: c_spine 2.0 sagittal · sagittal · 0.27mm/px · 3 of 80 slices shown]
[im 27/80  brain]
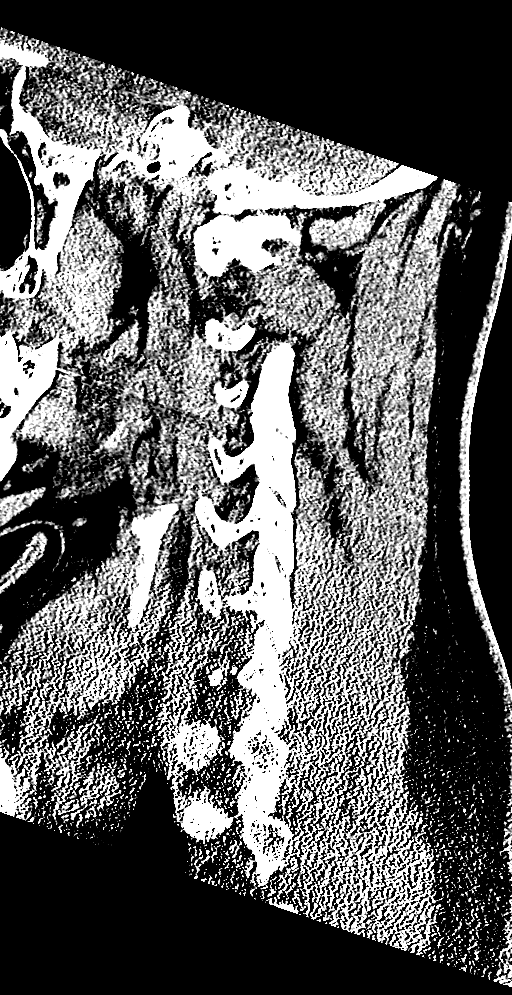
[im 40/80  brain]
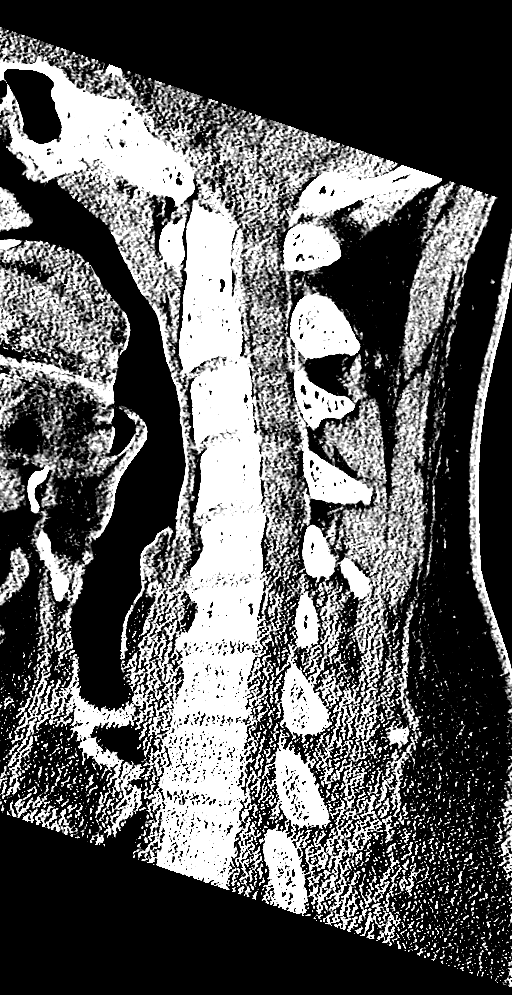
[im 53/80  brain]
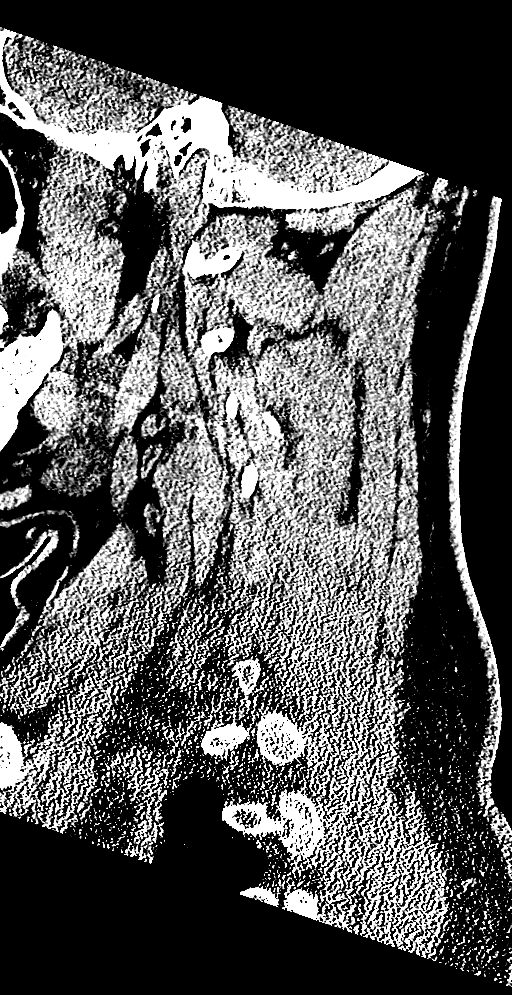

[15 of 47 positions shown; findings below may reference images not displayed]

FINDINGS: CT HEAD FINDINGS

No mass lesion. No midline shift. No acute hemorrhage or hematoma.
No extra-axial fluid collections. No evidence of acute infarction.
No skull fracture. There is partial opacification of the right
mastoid air cells.

CT CERVICAL SPINE FINDINGS

No paraspinous soft tissue abnormalities. Reversed lordosis. No
fracture. Mild C5-6 and C6-7 degenerative disc disease.
IMPRESSION: 1. Right mastoid effusion. No skull fracture. No acute intracranial
abnormalities.
2. No cervical spine fracture

## 2016-11-08 ENCOUNTER — Encounter (HOSPITAL_BASED_OUTPATIENT_CLINIC_OR_DEPARTMENT_OTHER): Payer: Self-pay | Admitting: Emergency Medicine

## 2016-11-08 ENCOUNTER — Emergency Department (HOSPITAL_BASED_OUTPATIENT_CLINIC_OR_DEPARTMENT_OTHER)
Admission: EM | Admit: 2016-11-08 | Discharge: 2016-11-08 | Disposition: A | Discharge status: Home / Self Care | Payer: Self-pay | Attending: Emergency Medicine | Admitting: Emergency Medicine

## 2016-11-08 DIAGNOSIS — R1012 Left upper quadrant pain: Secondary | ICD-10-CM | POA: Insufficient documentation

## 2016-11-08 DIAGNOSIS — Z79899 Other long term (current) drug therapy: Secondary | ICD-10-CM | POA: Insufficient documentation

## 2016-11-08 DIAGNOSIS — F17210 Nicotine dependence, cigarettes, uncomplicated: Secondary | ICD-10-CM | POA: Insufficient documentation

## 2016-11-08 HISTORY — DX: Malingerer (conscious simulation): Z76.5

## 2016-11-08 HISTORY — DX: Chronic pain syndrome: G89.4

## 2016-11-08 LAB — URINALYSIS, ROUTINE W REFLEX MICROSCOPIC
BILIRUBIN URINE: NEGATIVE
GLUCOSE, UA: NEGATIVE mg/dL
HGB URINE DIPSTICK: NEGATIVE
Ketones, ur: NEGATIVE mg/dL
Leukocytes, UA: NEGATIVE
Nitrite: NEGATIVE
PH: 7 (ref 5.0–8.0)
Protein, ur: NEGATIVE mg/dL
Specific Gravity, Urine: <1.005 — ABNORMAL LOW (ref 1.005–1.030)

## 2016-11-08 LAB — COMPREHENSIVE METABOLIC PANEL
ALBUMIN: 4.5 g/dL (ref 3.5–5.0)
ALT: 15 U/L — AB (ref 17–63)
AST: 18 U/L (ref 15–41)
Alkaline Phosphatase: 52 U/L (ref 38–126)
Anion gap: 8 (ref 5–15)
BILIRUBIN TOTAL: 0.2 mg/dL — AB (ref 0.3–1.2)
BUN: 6 mg/dL (ref 6–20)
CHLORIDE: 105 mmol/L (ref 101–111)
CO2: 25 mmol/L (ref 22–32)
Calcium: 9.1 mg/dL (ref 8.9–10.3)
Creatinine, Ser: 0.97 mg/dL (ref 0.61–1.24)
GFR calc Af Amer: >60 mL/min (ref >60)
GFR calc non Af Amer: >60 mL/min (ref >60)
GLUCOSE: 101 mg/dL — AB (ref 65–99)
Potassium: 3.6 mmol/L (ref 3.5–5.1)
Sodium: 138 mmol/L (ref 135–145)
Total Protein: 7.3 g/dL (ref 6.5–8.1)

## 2016-11-08 LAB — CBC
HCT: 42.9 % (ref 39.0–52.0)
Hemoglobin: 14.8 g/dL (ref 13.0–17.0)
MCH: 29.9 pg (ref 26.0–34.0)
MCHC: 34.5 g/dL (ref 30.0–36.0)
MCV: 86.7 fL (ref 78.0–100.0)
Platelets: 196 10*3/uL (ref 150–400)
RBC: 4.95 MIL/uL (ref 4.22–5.81)
RDW: 12.7 % (ref 11.5–15.5)
WBC: 8.8 10*3/uL (ref 4.0–10.5)

## 2016-11-08 LAB — LIPASE, BLOOD: LIPASE: 32 U/L (ref 11–51)

## 2016-11-08 MED ORDER — ONDANSETRON 4 MG PO TBDP: 4.0000 mg | ORAL_TABLET | Freq: Three times a day (TID) | ORAL | 0 refills | Status: AC | PRN

## 2016-11-08 MED ORDER — PANTOPRAZOLE SODIUM 20 MG PO TBEC: 20.0000 mg | DELAYED_RELEASE_TABLET | Freq: Every day | ORAL | 0 refills | Status: AC

## 2016-11-08 MED ORDER — FAMOTIDINE IN NACL 20-0.9 MG/50ML-% IV SOLN
20.0000 mg | Freq: Once | INTRAVENOUS | Status: AC
  Administered 2016-11-08: 20 mg via INTRAVENOUS
  Filled 2016-11-08: qty 50

## 2016-11-08 MED ORDER — SODIUM CHLORIDE 0.9 % IV BOLUS (SEPSIS)
500.0000 mL | Freq: Once | INTRAVENOUS | Status: AC
  Administered 2016-11-08: 500 mL via INTRAVENOUS

## 2016-11-08 MED ORDER — ONDANSETRON HCL 4 MG/2ML IJ SOLN
4.0000 mg | Freq: Once | INTRAMUSCULAR | Status: AC
  Administered 2016-11-08: 4 mg via INTRAVENOUS
  Filled 2016-11-08: qty 2

## 2016-11-08 NOTE — ED Triage Notes (Signed)
Patient reports that he is having pain to his left upper quadrant since last night. Reports that he has a hx of pancreatitis.

## 2016-11-08 NOTE — Discharge Instructions (Signed)
It was my pleasure taking care of you today!   Take Protonix daily.   It is very important that you see a primary care provider to discuss your abdominal pain. Please see the attached sheet for help finding a physician in the area.   At this time, there does not appear to be an acute, emergent cause for your abdominal pain. However, this does not mean that your abdominal pain may not become an emergency in the future. It is VERY important that you monitor your symptoms and return to the Emergency Department if you develop any of the following symptoms:  The pain does not go away.  You have a fever.  You keep throwing up and can't keep fluids down. You pass bloody or black tarry stools.  There is bright red blood in the stool. You do not seem to be getting better.  You have any questions or concerns.

## 2016-11-08 NOTE — ED Provider Notes (Signed)
[facility]   CSN: [id number] Arrival date & time: 11/08/16  1428     History   Chief Complaint Chief Complaint  Patient presents with  . Abdominal Pain    HPI Kyle Franklin is a 41 y.o. male.  The history is provided by the patient and medical records. No language interpreter was used.    Kyle Franklin is a 41 y.o. male  with a PMH of GERD, kidney stones who presents to the Emergency Department complaining of left upper abdominal pain which started this morning. He states pain feels similar to prior episodes of pancreatitis. Associated with nausea and vomiting. He took a dose of his wife's phenergan with adequate relief of n/v, however she only had one pill left and a few hours later, symptoms returned. No fever or chills. No chest pain, back pain, diarrhea, constipation or blood in the stool. No urinary symptoms. No alleviating or aggravating factors for abdominal pain.    Past Medical History:  Diagnosis Date  . Back pain   . Chronic pain disorder   . Depression   . Drug-seeking behavior   . GERD (gastroesophageal reflux disease)   . Kidney stone   . Kidney stones     Patient Active Problem List   Diagnosis Date Noted  . Right flank pain 01/04/2013    Past Surgical History:  Procedure Laterality Date  . APPENDECTOMY    . HERNIA REPAIR    . SHOULDER SURGERY         Home Medications    Prior to Admission medications   Medication Sig Start Date End Date Taking? Authorizing Provider  acetaminophen (TYLENOL) 325 MG tablet Take 2 tablets (650 mg total) by mouth every 6 (six) hours as needed. 09/07/16   Derwood Kaplan, MD  baclofen (LIORESAL) 10 MG tablet Take 10 mg by mouth Nightly.    [provider]  cyclobenzaprine (FLEXERIL) 10 MG tablet Take 1 tablet (10 mg total) by mouth 3 (three) times daily as needed for muscle spasms. 06/18/14   Pricilla Loveless, MD  DULoxetine HCl (CYMBALTA PO) Take by mouth.    [provider]  escitalopram (LEXAPRO) 20 MG tablet Take 20 mg by mouth daily.    [provider]  ibuprofen (ADVIL,MOTRIN) 600 MG tablet Take 1 tablet (600 mg total) by mouth every 6 (six) hours as needed for mild pain or moderate pain. 09/07/16   Derwood Kaplan, MD  methocarbamol (ROBAXIN) 500 MG tablet Take 2 tablets (1,000 mg total) by mouth every 8 (eight) hours as needed for muscle spasms. 07/02/15   Loren Racer, MD  naproxen (NAPROSYN) 500 MG tablet Take 1 tablet (500 mg total) by mouth 2 (two) times daily. 10/19/14   Patel-Mills, Lorelle Formosa, PA-C  omeprazole (PRILOSEC) 40 MG capsule Take 40 mg by mouth daily.    [provider]  ondansetron (ZOFRAN ODT) 4 MG disintegrating tablet Take 1 tablet (4 mg total) by mouth every 8 (eight) hours as needed for nausea or vomiting. 11/08/16   Ward, Chase Picket, PA-C  oxyCODONE-acetaminophen (PERCOCET/ROXICET) 5-325 MG per tablet Take 1 tablet by mouth every 8 (eight) hours as needed for severe pain. 06/23/14   Sciacca, [name], PA-C  pantoprazole (PROTONIX) 20 MG tablet Take 1 tablet (20 mg total) by mouth daily. 11/08/16   Ward, Chase Picket, PA-C  simvastatin (ZOCOR) 20 MG tablet Take 20 mg by mouth daily.    [provider]    Family History History reviewed. No pertinent  family history.  Social History Social History  Substance Use Topics  . Smoking status: Current Every Day Smoker    Packs/day: 1.00    Types: Cigarettes  . Smokeless tobacco: Never Used  . Alcohol use No     Allergies   Vicodin [hydrocodone-acetaminophen]; Ciprofloxacin; Tape; and Tramadol hcl   Review of Systems Review of Systems  Gastrointestinal: Positive for abdominal pain, nausea and vomiting.  All other systems reviewed and are negative.    Physical Exam Updated Vital Signs BP 107/90 (BP Location: Left Arm)   Pulse 70   Temp 98.5 F (36.9 C) (Oral)   Resp 16   Ht 5\' 7"  (1.702 m)   Wt 70.3 kg (155 lb)   SpO2 100%   BMI 24.28 kg/m    Physical Exam  Constitutional: He is oriented to person, place, and time. He appears well-developed and well-nourished. No distress.  HENT:  Head: Normocephalic and atraumatic.  Cardiovascular: Normal rate, regular rhythm and normal heart sounds.   No murmur heard. Pulmonary/Chest: Effort normal and breath sounds normal. No respiratory distress.  Abdominal: Soft. Bowel sounds are normal. He exhibits no distension.  Tenderness to palpation of epigastrium and left upper quadrant. No rebound tenderness.  Musculoskeletal: He exhibits no edema.  Neurological: He is alert and oriented to person, place, and time.  Skin: Skin is warm and dry.  Nursing note and vitals reviewed.    ED Treatments / Results  Labs (all labs ordered are listed, but only abnormal results are displayed) Labs Reviewed  COMPREHENSIVE METABOLIC PANEL - Abnormal; Notable for the following:       Result Value   Glucose, Bld 101 (*)    ALT 15 (*)    Total Bilirubin 0.2 (*)    All other components within normal limits  URINALYSIS, ROUTINE W REFLEX MICROSCOPIC - Abnormal; Notable for the following:    Specific Gravity, Urine <1.005 (*)    All other components within normal limits  LIPASE, BLOOD  CBC    EKG  EKG Interpretation None       Radiology No results found.  Procedures Procedures (including critical care time)  Medications Ordered in ED Medications  ondansetron (ZOFRAN) injection 4 mg (4 mg Intravenous Given 11/08/16 1820)  sodium chloride 0.9 % bolus 500 mL (0 mLs Intravenous Stopped 11/08/16 1904)  famotidine (PEPCID) IVPB 20 mg premix (0 mg Intravenous Stopped 11/08/16 1902)     Initial Impression / Assessment and Plan / ED Course  I have reviewed the triage vital signs and the nursing notes.  Pertinent labs & imaging results that were available during my care of the patient were reviewed by me and considered in my medical decision making (see chart for details).    Kyle Franklin is a 41  y.o. male who presents to ED for left upper quadrant abdominal pain which he feels is similar to prior episode of pancreatitis. Lipase today wdl. Patient is nontoxic, nonseptic appearing with a non-surgical abdominal exam. Patient's pain and other symptoms have been adequately managed in the Emergency Department today. Labs and vitals reviewed and reassuring. Patient does not meet the SIRS or Sepsis criteria. On repeat exam, abdominal exam with no peritoneal signs. Evaluation does not show pathology that would require ongoing emergent intervention or inpatient treatment. Patient discharged home with symptomatic treatment and encouraged to follow up with PCP. I have also discussed reasons to return immediately to the ER. Patient expresses understanding and agrees with plan as dictated above.  Patient discussed with Dr. Clarene DukeLittle who agrees with treatment plan.    Final Clinical Impressions(s) / ED Diagnoses   Final diagnoses:  Recurrent left upper quadrant abdominal pain    New Prescriptions Discharge Medication List as of 11/08/2016  7:14 PM    START taking these medications   Details  pantoprazole (PROTONIX) 20 MG tablet Take 1 tablet (20 mg total) by mouth daily., Starting Wed 11/08/2016, Print         Ward, Chase PicketJaime Pilcher, PA-C 11/08/16 2013    Clarene DukeLittle, Ambrose Finlandachel Morgan, MD 11/09/16 2227

## 2016-11-08 NOTE — ED Notes (Signed)
ED Provider at bedside. 

## 2021-03-14 ENCOUNTER — Other Ambulatory Visit: Payer: Self-pay

## 2021-03-14 ENCOUNTER — Encounter (HOSPITAL_BASED_OUTPATIENT_CLINIC_OR_DEPARTMENT_OTHER): Payer: Self-pay

## 2021-03-14 ENCOUNTER — Emergency Department (HOSPITAL_BASED_OUTPATIENT_CLINIC_OR_DEPARTMENT_OTHER)
Admission: EM | Admit: 2021-03-14 | Discharge: 2021-03-14 | Disposition: A | Discharge status: Home / Self Care | Payer: Self-pay | Attending: Emergency Medicine | Admitting: Emergency Medicine

## 2021-03-14 ENCOUNTER — Emergency Department (HOSPITAL_BASED_OUTPATIENT_CLINIC_OR_DEPARTMENT_OTHER): Payer: Self-pay

## 2021-03-14 DIAGNOSIS — R058 Other specified cough: Secondary | ICD-10-CM | POA: Insufficient documentation

## 2021-03-14 DIAGNOSIS — K029 Dental caries, unspecified: Secondary | ICD-10-CM | POA: Insufficient documentation

## 2021-03-14 DIAGNOSIS — K047 Periapical abscess without sinus: Secondary | ICD-10-CM | POA: Insufficient documentation

## 2021-03-14 DIAGNOSIS — Z79899 Other long term (current) drug therapy: Secondary | ICD-10-CM | POA: Insufficient documentation

## 2021-03-14 DIAGNOSIS — R051 Acute cough: Secondary | ICD-10-CM

## 2021-03-14 DIAGNOSIS — F17210 Nicotine dependence, cigarettes, uncomplicated: Secondary | ICD-10-CM | POA: Insufficient documentation

## 2021-03-14 DIAGNOSIS — Z20822 Contact with and (suspected) exposure to covid-19: Secondary | ICD-10-CM | POA: Insufficient documentation

## 2021-03-14 LAB — RESP PANEL BY RT-PCR (FLU A&B, COVID) ARPGX2
Influenza A by PCR: NEGATIVE
Influenza B by PCR: NEGATIVE
SARS Coronavirus 2 by RT PCR: NEGATIVE

## 2021-03-14 MED ORDER — AMOXICILLIN-POT CLAVULANATE 875-125 MG PO TABS: 1.0000 | ORAL_TABLET | Freq: Two times a day (BID) | ORAL | 0 refills | Status: AC

## 2021-03-14 NOTE — ED Provider Notes (Signed)
[place] EMERGENCY DEPARTMENT Provider Note   CSN: [id number] Arrival date & time: 03/14/21  1044     History  Chief Complaint  Patient presents with   Cough    Kyle Franklin is a 46 y.o. male who presents with 1 week of productive cough with brown-green sputum, intermittent headaches, and some pain and swelling to the roof of his mouth/gums.  Denies fevers but does endorse fatigue, denies any GI symptoms.  I have personally reviewed this patient's medical records.  He has history of GERD, kidney stones, depression, smoking 1 pack/day, and dental problems.  He states he is not on any medications daily.  He is not anticoagulated.  HPI     Home Medications Prior to Admission medications   Medication Sig Start Date End Date Taking? Authorizing Provider  amoxicillin-clavulanate (AUGMENTIN) 875-125 MG tablet Take 1 tablet by mouth every 12 (twelve) hours. 03/14/21  Yes Haruki Arnold, Gypsy Balsam, PA-C  acetaminophen (TYLENOL) 325 MG tablet Take 2 tablets (650 mg total) by mouth every 6 (six) hours as needed. 09/07/16   Varney Biles, MD  baclofen (LIORESAL) 10 MG tablet Take 10 mg by mouth Nightly.    [provider]  cyclobenzaprine (FLEXERIL) 10 MG tablet Take 1 tablet (10 mg total) by mouth 3 (three) times daily as needed for muscle spasms. 06/18/14   Sherwood Gambler, MD  DULoxetine HCl (CYMBALTA PO) Take by mouth.    [provider]  escitalopram (LEXAPRO) 20 MG tablet Take 20 mg by mouth daily.    [provider]  ibuprofen (ADVIL,MOTRIN) 600 MG tablet Take 1 tablet (600 mg total) by mouth every 6 (six) hours as needed for mild pain or moderate pain. 09/07/16   Varney Biles, MD  methocarbamol (ROBAXIN) 500 MG tablet Take 2 tablets (1,000 mg total) by mouth every 8 (eight) hours as needed for muscle spasms. 07/02/15   Julianne Rice, MD  naproxen (NAPROSYN) 500 MG tablet Take 1 tablet (500 mg total) by mouth 2 (two) times daily. 10/19/14    Patel-Mills, Orvil Feil, PA-C  omeprazole (PRILOSEC) 40 MG capsule Take 40 mg by mouth daily.    [provider]  ondansetron (ZOFRAN ODT) 4 MG disintegrating tablet Take 1 tablet (4 mg total) by mouth every 8 (eight) hours as needed for nausea or vomiting. 11/08/16   Ward, Ozella Almond, PA-C  oxyCODONE-acetaminophen (PERCOCET/ROXICET) 5-325 MG per tablet Take 1 tablet by mouth every 8 (eight) hours as needed for severe pain. 06/23/14   Sciacca, [name], PA-C  pantoprazole (PROTONIX) 20 MG tablet Take 1 tablet (20 mg total) by mouth daily. 11/08/16   Ward, Ozella Almond, PA-C  simvastatin (ZOCOR) 20 MG tablet Take 20 mg by mouth daily.    [provider]      Allergies    Vicodin [hydrocodone-acetaminophen], Ciprofloxacin, Tape, and Tramadol hcl    Review of Systems   Review of Systems  Constitutional: Negative.   HENT:  Positive for dental problem. Negative for sore throat and trouble swallowing.   Eyes: Negative.   Respiratory:  Positive for cough. Negative for chest tightness and shortness of breath.   Cardiovascular: Negative.   Gastrointestinal: Negative.   Genitourinary: Negative.   Musculoskeletal: Negative.   Neurological:  Positive for headaches. Negative for dizziness and light-headedness.  Hematological: Negative.    Physical Exam Updated Vital Signs BP 108/85 (BP Location: Right Arm)    Pulse 80    Temp 97.8 F (36.6 C) (Oral)    Resp 20  Ht 5\' 7"  (1.702 m)    Wt 72.6 kg    SpO2 100%    BMI 25.06 kg/m  Physical Exam Vitals and nursing note reviewed.  Constitutional:      Appearance: He is not ill-appearing or toxic-appearing.  HENT:     Head: Normocephalic and atraumatic.     Nose: Nose normal.     Mouth/Throat:     Mouth: Mucous membranes are moist.     Dentition: Abnormal dentition. Dental tenderness, gingival swelling and dental caries present. No dental abscesses.     Pharynx: Oropharynx is clear. Uvula midline. No oropharyngeal exudate or posterior  oropharyngeal erythema.     Tonsils: No tonsillar exudate.  Eyes:     General: Lids are normal. Vision grossly intact.        Right eye: No discharge.        Left eye: No discharge.     Extraocular Movements: Extraocular movements intact.     Conjunctiva/sclera: Conjunctivae normal.     Pupils: Pupils are equal, round, and reactive to light.  Neck:     Trachea: Trachea and phonation normal.  Cardiovascular:     Rate and Rhythm: Normal rate and regular rhythm.     Pulses: Normal pulses.     Heart sounds: Normal heart sounds. No murmur heard. Pulmonary:     Effort: Pulmonary effort is normal. No tachypnea, bradypnea, accessory muscle usage, prolonged expiration or respiratory distress.     Breath sounds: Normal breath sounds. No wheezing or rales.  Chest:     Chest wall: No mass, lacerations, deformity, swelling, tenderness, crepitus or edema.  Abdominal:     General: Bowel sounds are normal. There is no distension.     Palpations: Abdomen is soft.     Tenderness: There is no abdominal tenderness. There is no right CVA tenderness, left CVA tenderness, guarding or rebound.  Musculoskeletal:        General: No deformity.     Cervical back: Normal range of motion and neck supple. No tenderness.     Right lower leg: No edema.     Left lower leg: No edema.  Lymphadenopathy:     Cervical: No cervical adenopathy.  Skin:    General: Skin is warm and dry.     Capillary Refill: Capillary refill takes less than 2 seconds.     Findings: No rash.  Neurological:     General: No focal deficit present.     Mental Status: He is alert and oriented to person, place, and time. Mental status is at baseline.     Gait: Gait is intact.  Psychiatric:        Mood and Affect: Mood normal.    ED Results / Procedures / Treatments   Labs (all labs ordered are listed, but only abnormal results are displayed) Labs Reviewed  RESP PANEL BY RT-PCR (FLU A&B, COVID) [id number]    EKG None  Radiology DG  Chest 2 View  Result Date: 03/14/2021 CLINICAL DATA:  Cough EXAM: CHEST - 2 VIEW COMPARISON:  None. FINDINGS: The heart size and mediastinal contours are within normal limits. No focal airspace consolidation, pleural effusion, or pneumothorax. The visualized skeletal structures are unremarkable. IMPRESSION: No active cardiopulmonary disease. Electronically Signed   By: Davina Poke D.O.   On: 03/14/2021 15:12    Procedures Procedures    Medications Ordered in ED Medications - No data to display  ED Course/ Medical Decision Making/ A&P  Medical Decision Making 46 year old male presents with concern for 1 week of productive cough as well as dental pain.  Vital signs are normal on intake.  Cardiopulmonary exam is unremarkable, abdominal exam is benign.  Oropharyngeal exam concerning for extensive dental caries and decay with global gingival edema, worse on the right side with adjacent buccal mucosal edema on the maxillary aspect.  No sublingual or submental tenderness palpation or sign of oropharyngeal abscess.   Respiratory pathogen panel was obtained in triage and was negative.  Chest x-ray did patient's persistent cough and fatigue which was negative for acute acute airspace consolidation or abnormality.  Concern for dental infection, will discharge with antibiotic therapy.  Recommend close outpatient follow-up as well as dental follow-up.  Given reassuring vital signs and normal work-up, no further work-up is warranted in the ER at this time.  Patient is safe to be discharged home.  Oral antibiotics prescribed to his pharmacy for his dental infection and dental resources provided.  Cal voiced understanding of his medical evaluation and treatment plan.  Each of his questions was answered to his expressed satisfaction.  Return precautions were given.  Patient is well-appearing, stable, and appropriate for discharge at this time.   This chart was dictated using  voice recognition software, Dragon. Despite the best efforts of this provider to proofread and correct errors, errors may still occur which can change documentation meaning.  Final Clinical Impression(s) / ED Diagnoses Final diagnoses:  Acute cough  Dental infection    Rx / DC Orders ED Discharge Orders          Ordered    amoxicillin-clavulanate (AUGMENTIN) 875-125 MG tablet  Every 12 hours        03/14/21 1542              Brooklin Rieger, Gypsy Balsam, PA-C 03/14/21 1722    Malvin Johns, MD 03/15/21 1608

## 2021-03-14 NOTE — Discharge Instructions (Addendum)
You were seen in the ER today for your cough. Your chest xray was reassuring. You were found to have a dental infection. Please take entire course of antibiotics as directed.  Continue using ibuprofen / Tylenol, as needed for your symptoms.  You will need to follow-up with your dentist for continued management of this. Please see dental resources below. Return to the emergency department for fevers, swelling or pain under the tongue or in the neck, difficulty breathing or swallowing, nausea or vomiting that does not stop, or any other new or concerning symptoms.

## 2021-03-14 NOTE — ED Triage Notes (Signed)
Pt arrives ambulatory to ED with c/o productive cough states it is brown and green. Also c/o headaches and states he has had a bump come up in his mouth since he has been sick. Denies any fevers at home.

## 2021-05-24 ENCOUNTER — Emergency Department (HOSPITAL_BASED_OUTPATIENT_CLINIC_OR_DEPARTMENT_OTHER): Payer: 59

## 2021-05-24 ENCOUNTER — Encounter (HOSPITAL_BASED_OUTPATIENT_CLINIC_OR_DEPARTMENT_OTHER): Payer: Self-pay | Admitting: *Deleted

## 2021-05-24 ENCOUNTER — Emergency Department (HOSPITAL_BASED_OUTPATIENT_CLINIC_OR_DEPARTMENT_OTHER)
Admission: EM | Admit: 2021-05-24 | Discharge: 2021-05-24 | Disposition: A | Discharge status: Home / Self Care | Payer: 59 | Attending: Emergency Medicine | Admitting: Emergency Medicine

## 2021-05-24 ENCOUNTER — Other Ambulatory Visit: Payer: Self-pay

## 2021-05-24 DIAGNOSIS — M79672 Pain in left foot: Secondary | ICD-10-CM | POA: Insufficient documentation

## 2021-05-24 MED ORDER — OXYCODONE HCL 5 MG PO TABS: 5.0000 mg | ORAL_TABLET | Freq: Four times a day (QID) | ORAL | 0 refills | Status: AC | PRN

## 2021-05-24 NOTE — ED Triage Notes (Signed)
He has had a knot on the bottom of his left foot for a year. He is having pain when he walks. ?

## 2021-05-24 NOTE — ED Provider Notes (Signed)
?Kyle Franklin ?Provider Note ? ? ?CSN: [id number] ?Arrival date & time: 05/24/21  1818 ? ?  ? ?History ? ?Chief Complaint  ?Patient presents with  ? Foot Pain  ? ? ?Kyle Franklin is a 46 y.o. male. ? ?Left foot pain for several months, worse here recently.  No fever, no trauma no chills. ? ? ?Foot Pain ?This is a chronic problem. The current episode started more than 1 week ago. The problem occurs daily. The problem has not changed since onset.The symptoms are aggravated by walking. Nothing relieves the symptoms. He has tried nothing for the symptoms. The treatment provided no relief.  ? ?  ? ?Home Medications ?Prior to Admission medications   ?Medication Sig Start Date End Date Taking? Authorizing Provider  ?oxyCODONE (ROXICODONE) 5 MG immediate release tablet Take 1 tablet (5 mg total) by mouth every 6 (six) hours as needed for up to 10 doses for breakthrough pain. 05/24/21  Yes Kyle Teichert, Kyle Franklin  ?acetaminophen (TYLENOL) 325 MG tablet Take 2 tablets (650 mg total) by mouth every 6 (six) hours as needed. 09/07/16   Kyle Biles, Kyle Franklin  ?amoxicillin-clavulanate (AUGMENTIN) 875-125 MG tablet Take 1 tablet by mouth every 12 (twelve) hours. 03/14/21   Kyle Franklin, Kyle Balsam, Kyle Franklin  ?baclofen (LIORESAL) 10 MG tablet Take 10 mg by mouth Nightly.    Provider, Historical, Kyle Franklin  ?cyclobenzaprine (FLEXERIL) 10 MG tablet Take 1 tablet (10 mg total) by mouth 3 (three) times daily as needed for muscle spasms. 06/18/14   Kyle Gambler, Kyle Franklin  ?DULoxetine HCl (CYMBALTA PO) Take by mouth.    Provider, Historical, Kyle Franklin  ?escitalopram (LEXAPRO) 20 MG tablet Take 20 mg by mouth daily.    Provider, Historical, Kyle Franklin  ?ibuprofen (ADVIL,MOTRIN) 600 MG tablet Take 1 tablet (600 mg total) by mouth every 6 (six) hours as needed for mild pain or moderate pain. 09/07/16   Kyle Biles, Kyle Franklin  ?methocarbamol (ROBAXIN) 500 MG tablet Take 2 tablets (1,000 mg total) by mouth every 8 (eight) hours as needed for muscle spasms.  07/02/15   Kyle Rice, Kyle Franklin  ?naproxen (NAPROSYN) 500 MG tablet Take 1 tablet (500 mg total) by mouth 2 (two) times daily. 10/19/14   Kyle Franklin, Kyle Franklin, Kyle Franklin  ?omeprazole (PRILOSEC) 40 MG capsule Take 40 mg by mouth daily.    Provider, Historical, Kyle Franklin  ?ondansetron (ZOFRAN ODT) 4 MG disintegrating tablet Take 1 tablet (4 mg total) by mouth every 8 (eight) hours as needed for nausea or vomiting. 11/08/16   Kyle Franklin, Kyle Almond, Kyle Franklin  ?oxyCODONE-acetaminophen (PERCOCET/ROXICET) 5-325 MG per tablet Take 1 tablet by mouth every 8 (eight) hours as needed for severe pain. 06/23/14   Kyle Franklin, [name], Kyle Franklin  ?pantoprazole (PROTONIX) 20 MG tablet Take 1 tablet (20 mg total) by mouth daily. 11/08/16   Kyle Franklin, Kyle Almond, Kyle Franklin  ?simvastatin (ZOCOR) 20 MG tablet Take 20 mg by mouth daily.    Provider, Historical, Kyle Franklin  ?   ? ?Allergies    ?Vicodin [hydrocodone-acetaminophen], Ciprofloxacin, Tape, and Tramadol hcl   ? ?Review of Systems   ?Review of Systems ? ?Physical Exam ?Updated Vital Signs ?BP 111/78 (BP Location: Right Arm)   Pulse 72   Temp 98 ?F (36.7 ?C)   Resp 18   Ht 5\' 7"  (1.702 m)   Wt 74.8 kg   SpO2 96%   BMI 25.84 kg/m?  ?Physical Exam ?Constitutional:   ?   General: He is not in acute distress. ?   Appearance: He is not ill-appearing.  ?  Musculoskeletal:     ?   General: Tenderness present. No swelling. Normal range of motion.  ?   Comments: Tenderness to left foot  ?Skin: ?   General: Skin is warm.  ?   Findings: No erythema.  ?Neurological:  ?   General: No focal deficit present.  ?   Mental Status: He is alert.  ?   Sensory: No sensory deficit.  ?   Motor: No weakness.  ? ? ?ED Results / Procedures / Treatments   ?Labs ?(all labs ordered are listed, but only abnormal results are displayed) ?Labs Reviewed - No data to display ? ?EKG ?None ? ?Radiology ?DG Foot Complete Left ? ?Result Date: 05/24/2021 ?CLINICAL DATA:  Pain for 1 year. EXAM: LEFT FOOT - COMPLETE 3+ VIEW COMPARISON:  Heel radiograph 06/28/2020  FINDINGS: There is no evidence of fracture or dislocation. Normal alignment and joint spaces. No erosions or periosteal reaction. There is a small plantar calcaneal spur. Small Achilles tendon enthesophyte with thickening of the soft tissues in the region of the Achilles tendon insertion. This is chronic but increased from prior exam. IMPRESSION: 1. No acute findings. 2. Small plantar calcaneal spur. 3. Achilles tendon enthesophyte with thickening of the soft tissues in the region of the Achilles tendon insertion, chronic but increased from prior exam. Findings suggest chronic Achilles tendinopathy. Electronically Signed   By: Keith Rake M.D.   On: 05/24/2021 19:02   ? ?Procedures ?Procedures  ? ? ?Medications Ordered in ED ?Medications - No data to display ? ?ED Course/ Medical Decision Making/ A&P ?  ?                        ?Medical Decision Making ?Amount and/or Complexity of Data Reviewed ?Radiology: ordered. ? ?Risk ?Prescription drug management. ? ? ?Kyle Franklin is here with acute on chronic left foot pain.  Normal vitals.  No fever.  Pain worsened over the last several days.  He has what appears to be some hardening of the back of the left foot where he is mostly tender.  X-ray of the left foot was done and per radiology read there is Achilles tendon tendinopathy changes.  Overall chronic appearing but increased from his prior exams.  He does have a small plantar calcaneal spur as well.  Overall suspect acute on chronic inflammation of this area.  No concern for infectious process.  We will put him in a walking boot.  Recommend Tylenol, ibuprofen.  We will write Roxicodone for breakthrough pain.  We will have him follow-up with podiatry.  Neurovascular neuromuscular intact otherwise.  Discharge. ? ?This chart was dictated using voice recognition software.  Despite best efforts to proofread,  errors can occur which can change the documentation meaning.  ? ? ? ? ? ? ? ?Final Clinical Impression(s) /  ED Diagnoses ?Final diagnoses:  ?Foot pain, left  ? ? ?Rx / DC Orders ?ED Discharge Orders   ? ?      Ordered  ?  oxyCODONE (ROXICODONE) 5 MG immediate release tablet  Every 6 hours PRN       ? 05/24/21 1907  ? ?  ?  ? ?  ? ? ?  ?Lennice Sites, Kyle Franklin ?05/24/21 1908 ? ?

## 2021-05-24 NOTE — Discharge Instructions (Signed)
X-ray does appear to have chronic Achilles tendinitis.  There is no fracture.  Wear walking boot for comfort.  Recommend Tylenol, ibuprofen, oxycodone for breakthrough pain.  Follow-up with podiatry. ?
